# Patient Record
Sex: Male | Born: 1960 | Race: Black or African American | Hispanic: No | Marital: Married | Smoking: Former smoker
Health system: Southern US, Community
[De-identification: ages and names within clinical notes are randomized; demographics above are authoritative.]

## PROBLEM LIST (undated history)

## (undated) DIAGNOSIS — Z860100 Personal history of colon polyps, unspecified: Secondary | ICD-10-CM

## (undated) DIAGNOSIS — I10 Essential (primary) hypertension: Secondary | ICD-10-CM

## (undated) DIAGNOSIS — R0609 Other forms of dyspnea: Secondary | ICD-10-CM

## (undated) DIAGNOSIS — R972 Elevated prostate specific antigen [PSA]: Secondary | ICD-10-CM

## (undated) DIAGNOSIS — J45909 Unspecified asthma, uncomplicated: Secondary | ICD-10-CM

## (undated) DIAGNOSIS — C61 Malignant neoplasm of prostate: Secondary | ICD-10-CM

## (undated) DIAGNOSIS — R42 Dizziness and giddiness: Secondary | ICD-10-CM

## (undated) DIAGNOSIS — N200 Calculus of kidney: Secondary | ICD-10-CM

## (undated) DIAGNOSIS — G43909 Migraine, unspecified, not intractable, without status migrainosus: Secondary | ICD-10-CM

## (undated) DIAGNOSIS — R0600 Dyspnea, unspecified: Secondary | ICD-10-CM

## (undated) DIAGNOSIS — K219 Gastro-esophageal reflux disease without esophagitis: Secondary | ICD-10-CM

## (undated) HISTORY — DX: Dyspnea, unspecified: R06.00

## (undated) HISTORY — DX: Gastro-esophageal reflux disease without esophagitis: K21.9

## (undated) HISTORY — DX: Personal history of colonic polyps: Z86.010

## (undated) HISTORY — DX: Other forms of dyspnea: R06.09

## (undated) HISTORY — DX: Personal history of colon polyps, unspecified: Z86.0100

## (undated) HISTORY — DX: Elevated prostate specific antigen (PSA): R97.20

## (undated) HISTORY — PX: OTHER SURGICAL HISTORY: SHX169

## (undated) HISTORY — DX: Unspecified asthma, uncomplicated: J45.909

## (undated) HISTORY — DX: Dizziness and giddiness: R42

## (undated) HISTORY — DX: Migraine, unspecified, not intractable, without status migrainosus: G43.909

---

## 1998-12-26 ENCOUNTER — Emergency Department (HOSPITAL_COMMUNITY): Admission: EM | Admit: 1998-12-26 | Discharge: 1998-12-26 | Payer: Self-pay | Admitting: Emergency Medicine

## 1998-12-26 ENCOUNTER — Encounter: Payer: Self-pay | Admitting: Emergency Medicine

## 1999-01-03 ENCOUNTER — Encounter: Admission: RE | Admit: 1999-01-03 | Discharge: 1999-01-03 | Payer: Self-pay | Admitting: Internal Medicine

## 1999-02-02 ENCOUNTER — Encounter: Admission: RE | Admit: 1999-02-02 | Discharge: 1999-02-02 | Payer: Self-pay | Admitting: Internal Medicine

## 2001-03-29 ENCOUNTER — Emergency Department (HOSPITAL_COMMUNITY): Admission: EM | Admit: 2001-03-29 | Discharge: 2001-03-29 | Payer: Self-pay | Admitting: Emergency Medicine

## 2002-04-08 ENCOUNTER — Encounter (INDEPENDENT_AMBULATORY_CARE_PROVIDER_SITE_OTHER): Payer: Self-pay | Admitting: *Deleted

## 2002-04-08 ENCOUNTER — Ambulatory Visit (HOSPITAL_BASED_OUTPATIENT_CLINIC_OR_DEPARTMENT_OTHER): Admission: RE | Admit: 2002-04-08 | Discharge: 2002-04-08 | Payer: Self-pay | Admitting: General Surgery

## 2005-03-21 ENCOUNTER — Emergency Department (HOSPITAL_COMMUNITY): Admission: EM | Admit: 2005-03-21 | Discharge: 2005-03-21 | Payer: Self-pay | Admitting: Emergency Medicine

## 2007-04-01 ENCOUNTER — Emergency Department (HOSPITAL_COMMUNITY): Admission: EM | Admit: 2007-04-01 | Discharge: 2007-04-01 | Payer: Self-pay | Admitting: Emergency Medicine

## 2007-04-03 ENCOUNTER — Emergency Department (HOSPITAL_COMMUNITY): Admission: EM | Admit: 2007-04-03 | Discharge: 2007-04-03 | Payer: Self-pay | Admitting: Emergency Medicine

## 2010-08-17 NOTE — Op Note (Signed)
Rodney Graves, Rodney Graves                           ACCOUNT NO.:  [id number]   MEDICAL RECORD NO.:  [id number]                   PATIENT TYPE:  AMB   LOCATION:  DSC                                  FACILITY:  [facility]   PHYSICIAN:  Ollen Gross. Vernell Morgans, M.D.              DATE OF BIRTH:  May 02, 1960   DATE OF PROCEDURE:  04/08/2002  DATE OF DISCHARGE:                                 OPERATIVE REPORT   PREOPERATIVE DIAGNOSES:  Lipoma of right forearm, left shoulder and small  mole on left forearm.   POSTOPERATIVE DIAGNOSES:  Lipoma of right forearm, left shoulder and small  mole on left forearm.   OPERATION PERFORMED:  Excision of lipoma from right forearm, left shoulder  and mole from left forearm.   SURGEON:  Ollen Gross. Carolynne Edouard, M.D.   ANESTHESIA:  Local with sedation.   DESCRIPTION OF PROCEDURE:  After informed consent was obtained, the patient  was brought to the operating room and placed in supine position on the  operating table.  After some IV sedation had been given, the patient's  initially right forearm was prepped with Betadine and draped in the usual  sterile manner.  The area in question was infiltrated with 1% lidocaine with  epinephrine and a small longitudinal incision was made overlying the mass in  question.  The mass was then able to be excised sharply with a scissors from  the rest of the subcutaneous tissue.  Hemostasis was achieved using the  Bovie electrocautery.  Once this was accomplished, the incision was closed  with a running 4-0 Monocryl subcuticular stitch.  Benzoin, Steri-Strips and  sterile dressings were applied.  Next, attention was then turned to the left  shoulder and left forearm.  These areas were prepped with Betadine and  draped in the usual sterile manner as was the initial lesion on the right  arm.  Each of the areas in question was infiltrated with 1% lidocaine with  epinephrine.  A small elliptical incision was made around the mole on the  left forearm  sharply with a 15 blade knife.  This incision was carried down  to the subcutaneous fat and the area was excised sharply with a 15 blade  knife.  The incision was then closed with interrupted 4-0 Monocryl  subcuticular stitches.  Benzoin, Steri-Strips and sterile dressings were  applied.  Next, attention was then turned to the right shoulder.  Again the  area had already been infiltrated with 1% lidocaine with epinephrine.  A  longitudinal incision was made overlying the mass in question with a 15  blade knife and the fatty tissue was separated from the rest of the  subcutaneous fat using sharp dissection with a pair of Metzenbaum scissors.  This was done circumferentially until the mass was freed and removed from  the patient.  The wound was examined and found to be hemostatic.  The skin  incision was then closed with running 4-0 Monocryl subcuticular stitch.  Each of the lipomas was approximately 3 cm in diameter and the lesion on the  left forearm was approximately  1 cm in diameter.  Benzoin, Steri-Strips and sterile dressings were applied  to all incisions.  The patient was taken to the recovery room in stable  condition.                                                   Ollen Gross. Vernell Morgans, M.D.   PST/MEDQ  D:  04/08/2002  T:  04/08/2002  Job:  [id number]

## 2011-01-04 LAB — CULTURE, ROUTINE-ABSCESS

## 2011-04-11 ENCOUNTER — Ambulatory Visit (INDEPENDENT_AMBULATORY_CARE_PROVIDER_SITE_OTHER): Payer: Self-pay | Admitting: Surgery

## 2013-08-30 ENCOUNTER — Encounter (HOSPITAL_COMMUNITY): Payer: Self-pay | Admitting: Emergency Medicine

## 2013-08-30 ENCOUNTER — Emergency Department (HOSPITAL_COMMUNITY)
Admission: EM | Admit: 2013-08-30 | Discharge: 2013-08-30 | Disposition: A | Discharge status: Home / Self Care | Payer: BC Managed Care – PPO | Attending: Emergency Medicine | Admitting: Emergency Medicine

## 2013-08-30 ENCOUNTER — Emergency Department (HOSPITAL_COMMUNITY): Payer: BC Managed Care – PPO

## 2013-08-30 DIAGNOSIS — I10 Essential (primary) hypertension: Secondary | ICD-10-CM | POA: Insufficient documentation

## 2013-08-30 DIAGNOSIS — R112 Nausea with vomiting, unspecified: Secondary | ICD-10-CM | POA: Insufficient documentation

## 2013-08-30 DIAGNOSIS — N200 Calculus of kidney: Secondary | ICD-10-CM

## 2013-08-30 HISTORY — DX: Essential (primary) hypertension: I10

## 2013-08-30 HISTORY — DX: Calculus of kidney: N20.0

## 2013-08-30 LAB — URINE MICROSCOPIC-ADD ON

## 2013-08-30 LAB — COMPREHENSIVE METABOLIC PANEL
ALK PHOS: 86 U/L (ref 39–117)
ALT: 24 U/L (ref 0–53)
AST: 25 U/L (ref 0–37)
Albumin: 4.2 g/dL (ref 3.5–5.2)
BILIRUBIN TOTAL: 1 mg/dL (ref 0.3–1.2)
BUN: 12 mg/dL (ref 6–23)
CHLORIDE: 106 meq/L (ref 96–112)
CO2: 20 mEq/L (ref 19–32)
CREATININE: 1.16 mg/dL (ref 0.50–1.35)
Calcium: 9.2 mg/dL (ref 8.4–10.5)
GFR calc non Af Amer: 69 mL/min — ABNORMAL LOW (ref >90)
GFR, EST AFRICAN AMERICAN: 80 mL/min — AB (ref >90)
GLUCOSE: 132 mg/dL — AB (ref 70–99)
Potassium: 3.8 mEq/L (ref 3.7–5.3)
Sodium: 142 mEq/L (ref 137–147)
Total Protein: 7.7 g/dL (ref 6.0–8.3)

## 2013-08-30 LAB — CBC WITH DIFFERENTIAL/PLATELET
BASOS PCT: 0 % (ref 0–1)
Basophils Absolute: 0 10*3/uL (ref 0.0–0.1)
EOS ABS: 0 10*3/uL (ref 0.0–0.7)
Eosinophils Relative: 0 % (ref 0–5)
HEMATOCRIT: 35.7 % — AB (ref 39.0–52.0)
Hemoglobin: 12.1 g/dL — ABNORMAL LOW (ref 13.0–17.0)
LYMPHS ABS: 1.7 10*3/uL (ref 0.7–4.0)
Lymphocytes Relative: 15 % (ref 12–46)
MCH: 29.8 pg (ref 26.0–34.0)
MCHC: 33.9 g/dL (ref 30.0–36.0)
MCV: 87.9 fL (ref 78.0–100.0)
MONOS PCT: 4 % (ref 3–12)
Monocytes Absolute: 0.5 10*3/uL (ref 0.1–1.0)
NEUTROS ABS: 9.4 10*3/uL — AB (ref 1.7–7.7)
Neutrophils Relative %: 81 % — ABNORMAL HIGH (ref 43–77)
Platelets: 251 10*3/uL (ref 150–400)
RBC: 4.06 MIL/uL — ABNORMAL LOW (ref 4.22–5.81)
RDW: 12.6 % (ref 11.5–15.5)
WBC: 11.7 10*3/uL — ABNORMAL HIGH (ref 4.0–10.5)

## 2013-08-30 LAB — URINALYSIS, ROUTINE W REFLEX MICROSCOPIC
BILIRUBIN URINE: NEGATIVE
Glucose, UA: NEGATIVE mg/dL
KETONES UR: NEGATIVE mg/dL
LEUKOCYTES UA: NEGATIVE
NITRITE: NEGATIVE
PH: 7 (ref 5.0–8.0)
PROTEIN: 30 mg/dL — AB
Specific Gravity, Urine: 1.019 (ref 1.005–1.030)
UROBILINOGEN UA: 1 mg/dL (ref 0.0–1.0)

## 2013-08-30 LAB — LIPASE, BLOOD: LIPASE: 32 U/L (ref 11–59)

## 2013-08-30 MED ORDER — OXYCODONE-ACETAMINOPHEN 5-325 MG PO TABS: 1.0000 | ORAL_TABLET | Freq: Four times a day (QID) | ORAL | Status: DC | PRN

## 2013-08-30 MED ORDER — ONDANSETRON HCL 4 MG/2ML IJ SOLN
4.0000 mg | Freq: Once | INTRAMUSCULAR | Status: AC
  Administered 2013-08-30: 4 mg via INTRAVENOUS
  Filled 2013-08-30: qty 2

## 2013-08-30 MED ORDER — TAMSULOSIN HCL 0.4 MG PO CAPS: 0.4000 mg | ORAL_CAPSULE | Freq: Every day | ORAL | Status: DC

## 2013-08-30 MED ORDER — ONDANSETRON HCL 4 MG PO TABS: 4.0000 mg | ORAL_TABLET | Freq: Four times a day (QID) | ORAL | Status: DC

## 2013-08-30 MED ORDER — HYDROMORPHONE HCL PF 1 MG/ML IJ SOLN
1.0000 mg | Freq: Once | INTRAMUSCULAR | Status: AC
  Administered 2013-08-30: 1 mg via INTRAVENOUS
  Filled 2013-08-30: qty 1

## 2013-08-30 NOTE — ED Notes (Signed)
Pt oxygen saturation 88%-90% after medication given, pt placed on nasal cannula 2L, oxygen saturation 98%.

## 2013-08-30 NOTE — ED Notes (Signed)
Pt from home with c/o right flank pain with radiation of pain to right side of lower back. Pt states hx of kidney stones x4, states he started having increase in pain and abdominal pain at 0900 this morning. Pt also reports n/v but denies any diarrhea. States decrease in urine output and states urine has been darker in color. Pt in pain upon arrival to room. Axo x4. Skin clammy and diaphoretic.

## 2013-08-30 NOTE — Discharge Instructions (Signed)
Kidney Stones Kidney stones (urolithiasis) are deposits that form inside your kidneys. The intense pain is caused by the stone moving through the urinary tract. When the stone moves, the ureter goes into spasm around the stone. The stone is usually passed in the urine.  CAUSES   A disorder that makes certain neck glands produce too much parathyroid hormone (primary hyperparathyroidism).  A buildup of uric acid crystals, similar to gout in your joints.  Narrowing (stricture) of the ureter.  A kidney obstruction present at birth (congenital obstruction).  Previous surgery on the kidney or ureters.  Numerous kidney infections. SYMPTOMS   Feeling sick to your stomach (nauseous).  Throwing up (vomiting).  Blood in the urine (hematuria).  Pain that usually spreads (radiates) to the groin.  Frequency or urgency of urination. DIAGNOSIS   Taking a history and physical exam.  Blood or urine tests.  CT scan.  Occasionally, an examination of the inside of the urinary bladder (cystoscopy) is performed. TREATMENT   Observation.  Increasing your fluid intake.  Extracorporeal shock wave lithotripsy This is a noninvasive procedure that uses shock waves to break up kidney stones.  Surgery may be needed if you have severe pain or persistent obstruction. There are various surgical procedures. Most of the procedures are performed with the use of small instruments. Only small incisions are needed to accommodate these instruments, so recovery time is minimized. The size, location, and chemical composition are all important variables that will determine the proper choice of action for you. Talk to your health care provider to better understand your situation so that you will minimize the risk of injury to yourself and your kidney.  HOME CARE INSTRUCTIONS   Drink enough water and fluids to keep your urine clear or pale yellow. This will help you to pass the stone or stone fragments.  Strain  all urine through the provided strainer. Keep all particulate matter and stones for your health care provider to see. The stone causing the pain may be as small as a grain of salt. It is very important to use the strainer each and every time you pass your urine. The collection of your stone will allow your health care provider to analyze it and verify that a stone has actually passed. The stone analysis will often identify what you can do to reduce the incidence of recurrences.  Only take over-the-counter or prescription medicines for pain, discomfort, or fever as directed by your health care provider.  Make a follow-up appointment with your health care provider as directed.  Get follow-up X-rays if required. The absence of pain does not always mean that the stone has passed. It may have only stopped moving. If the urine remains completely obstructed, it can cause loss of kidney function or even complete destruction of the kidney. It is your responsibility to make sure X-rays and follow-ups are completed. Ultrasounds of the kidney can show blockages and the status of the kidney. Ultrasounds are not associated with any radiation and can be performed easily in a matter of minutes. SEEK MEDICAL CARE IF:  You experience pain that is progressive and unresponsive to any pain medicine you have been prescribed. SEEK IMMEDIATE MEDICAL CARE IF:   Pain cannot be controlled with the prescribed medicine.  You have a fever or shaking chills.  The severity or intensity of pain increases over 18 hours and is not relieved by pain medicine.  You develop a new onset of abdominal pain.  You feel faint or pass  out.  You are unable to urinate. MAKE SURE YOU:   Understand these instructions.  Will watch your condition.  Will get help right away if you are not doing well or get worse. Document Released: 03/18/2005 Document Revised: 11/18/2012 Document Reviewed: 08/19/2012 [facility] Patient Information 2014  [place].  Diet for Kidney Stones Kidney stones are small, hard masses that form inside your kidneys. They are made up of salts and minerals and often form when high levels build up in the urine. The minerals can then start to build up, crystalize, and stick together to form stones. There are several different types of kidney stones. The following types of stones may be influenced by dietary factors:   Calcium Oxalate Stones. An oxalate is a salt found in certain foods. Within the body, calcium can combine with oxalates to form calcium oxalate stones, which can be excreted in the urine in high amounts. This is the most common type of kidney stone.  Calcium Phosphate Stones. These stones may occur when the pH of the urine becomes too high, or less acidic, from too much calcium being excreted in the urine. The pH is a measure of how acidic or basic a substance is.  Uric Acid Stones. This type of stone occurs when the pH of the urine becomes too low, or very acidic, because substances called purines build up in the urine. Purines are found in animal proteins. When the urine is highly concentrated with acid, uric acid kidney stones can form.  Other risk factors for kidney stones include genetics, environment, and being overweight. Your caregiver may ask you to follow specific diet guidelines based on the type of stone you have to lessen the chances of your body making more kidney stones.  GENERAL GUIDELINES FOR ALL TYPES OF STONES  Drink plenty of fluid. Drink 12 16 cups of fluid a day, drinking mainly water.This is the most important thing you can do to prevent the formation of future kidney stones.  Maintain a healthy weight. Your caregiver or dietitian can help you determine what a healthy weight is for you. If you are overweight, weight loss may help prevent the formation of future kidney stones.  Eat a diet adequate in animal protein. Too much animal protein can contribute to the formation  of stones. Your dietitian can help you determine how much protein you should be eating. Avoid low carbohydrate, high protein diets.  Follow a balanced eating approach. The DASH diet, which stands for "Dietary Approaches to Stop Hypertension," is an effective meal plan for reducing stone formation. This diet is high in fruits, vegetables, dairy, and whole grains and low in animal protein. Ask your caregiver or dietitian for information about the DASH diet. ADDITIONAL DIET GUIDELINES FOR CALCIUM STONES Avoid foods high in salt. This includes table salt, salt seasonings, MSG, soy sauce, cured and processed meats, salted crackers and snack foods, fast food, and canned soups and foods. Ask your caregiver or dietitian for information about reducing sodium in your diet or following the low sodium diet.  Ensure adequate calcium intake. Use the following table for calcium guidelines:  Men 81 years old and younger  1000 mg/day.  Men 46 years old and older  1500 mg/day.  Women 51 55 years old  1000 mg/day.  Women 50 years and older  1500 mg/day. Your dietitian can help you determine if you are getting enough calcium in your diet. Foods that are high in calcium include dairy products, broccoli, cheese, yogurt, and  pudding. If you need to take a calcium supplement, take it only in the form of calcium citrate.  °Avoid foods high in oxalate. Be sure that any supplements you take do not contain more than 500 mg of vitamin C. Vitamin C is converted into oxalate in the body. You do not need to avoid fruits and vegetables high in vitamin C.  °· Grains: High-fiber or bran cereal, whole-wheat bread, grits, barley, buckwheat, amaranth, pretzels, and fruitcake. °· Vegetables: Dried beans, wax beans, dark leafy greens, eggplant, leeks, okra, parsley, rutabaga, tomato paste, watercress, zucchini, and escarole. °· Fruit: Dried apricots, red currants, figs, kiwi, and rhubarb. °· Meat and Meat Substitutes: Soybeans and foods made  from soy (soyburger, miso), dried beans, peanut butter. °· Milk: Chocolate milk mixes and soymilk. °· Fats and Oils: Nuts (peanuts, almonds, pecans, cashews, hazelnuts) and nut butters, sesame seeds, and tDahini paste. °· Condiments/Miscellaneous: Chocolate, carob, marmalade, poppy seeds, instant iced tea, and juice from high-oxalate fruits.    °Document Released: 07/13/2010 Document Revised: 09/17/2011 Document Reviewed: 09/02/2011 °[employer]® Patient Information ©2014 [employer]. ° °

## 2013-08-30 NOTE — ED Provider Notes (Signed)
CSN: [id number]     Arrival date & time 08/30/13  1253 History   First MD Initiated Contact with Patient 08/30/13 1317     Chief Complaint  Patient presents with  . Flank Pain  . Nausea     (Consider location/radiation/quality/duration/timing/severity/associated sxs/prior Treatment) HPI Comments: Patient presents with a chief complaint of right flank pain.  He reports that the pain radiates to his right groin.  He reports that the pain has been intermittent since 9 AM this morning.  He reports that at time the pain becomes very sharp and then the pain will ease up.  He has not taken anything for pain prior to arrival.  He reports four episodes of associated vomiting.  He denies diarrhea.  Denies abdominal pain.  Denies fever or chills.  Denies dysuria, hematuria, or increased urinary frequency.  He reports that his urine has been darker in color.  He reports a history of Kidney Stones.  Last stone was 19 years ago.  Patient is a 53 y.o. male presenting with flank pain. The history is provided by the patient.  Flank Pain    Past Medical History  Diagnosis Date  . Hypertension   . Kidney stone    History reviewed. No pertinent past surgical history. History reviewed. No pertinent family history. History  Substance Use Topics  . Smoking status: Never Smoker   . Smokeless tobacco: Not on file  . Alcohol Use: No    Review of Systems  Genitourinary: Positive for flank pain.  All other systems reviewed and are negative.     Allergies  Codeine  Home Medications   Prior to Admission medications   Medication Sig Start Date End Date Taking? Authorizing Provider  amLODipine (NORVASC) 5 MG tablet Take 5 mg by mouth daily. 08/09/13  Yes Historical Provider, MD   BP 128/81  Pulse 66  Temp(Src) 98.3 F (36.8 C) (Oral)  Resp 20  Ht 6\' 1"  (1.854 m)  Wt 195 lb (88.451 kg)  BMI 25.73 kg/m2  SpO2 98% Physical Exam  Nursing note and vitals reviewed. Constitutional: He appears  well-developed and well-nourished.  HENT:  Head: Normocephalic and atraumatic.  Mouth/Throat: Oropharynx is clear and moist.  Neck: Normal range of motion. Neck supple.  Cardiovascular: Normal rate, regular rhythm and normal heart sounds.   Pulmonary/Chest: Effort normal and breath sounds normal.  Abdominal: Soft. Bowel sounds are normal. He exhibits no distension and no mass. There is no tenderness. There is no rebound and no guarding.  Right CVA tenderness  Neurological: He is alert.  Skin: Skin is warm and dry.  Psychiatric: He has a normal mood and affect.    ED Course  Procedures (including critical care time) Labs Review Labs Reviewed  CBC WITH DIFFERENTIAL - Abnormal; Notable for the following:    WBC 11.7 (*)    RBC 4.06 (*)    Hemoglobin 12.1 (*)    HCT 35.7 (*)    Neutrophils Relative % 81 (*)    Neutro Abs 9.4 (*)    All other components within normal limits  COMPREHENSIVE METABOLIC PANEL - Abnormal; Notable for the following:    Glucose, Bld 132 (*)    GFR calc non Af Amer 69 (*)    GFR calc Af Amer 80 (*)    All other components within normal limits  LIPASE, BLOOD  URINALYSIS, ROUTINE W REFLEX MICROSCOPIC    Imaging Review Ct Abdomen Pelvis Wo Contrast  08/30/2013   CLINICAL DATA:  Right  flank pain  EXAM: CT ABDOMEN AND PELVIS WITHOUT CONTRAST  TECHNIQUE: Multidetector CT imaging of the abdomen and pelvis was performed following the standard protocol without IV contrast.  COMPARISON:  None.  FINDINGS: Lower Chest: Dependent atelectasis in the lower lobes. No suspicious pulmonary nodule or mass. Borderline cardiomegaly. The intracardiac blood pool is hypodense relative to the adjacent myocardium consistent with anemia. Trace calcification of the papillary muscles on the left ventricle suggest sequelae of remote myocardial infarction. Unremarkable visualized thoracic esophagus.  Abdomen: Unenhanced CT was performed per clinician order. Lack of IV contrast limits  sensitivity and specificity, especially for evaluation of abdominal/pelvic solid viscera. Within these limitations, unremarkable CT appearance of the stomach, duodenum, spleen, adrenal glands and pancreas. Normal hepatic contour and morphology. No discrete hepatic lesion. Gallbladder is unremarkable. No intra or extrahepatic biliary ductal dilatation.  Moderate right hydronephrosis secondary to an obstructing 4 mm stone in the mid ureter. There is mild right renal edema and perinephric stranding. No additional nephrolithiasis is identified involving either kidney.  No evidence of obstruction or focal bowel wall thickening. Normal appendix in the right lower quadrant. The terminal ileum is unremarkable. No free fluid or suspicious adenopathy.  Pelvis: Unremarkable bladder, prostate gland and seminal vesicles. No free fluid or suspicious adenopathy.  Bones/Soft Tissues: No acute fracture. Mildly abnormal appearance of the marrow space of the left iliac bone with regions of circumscribes the lucency with narrow zones of transition and patchy areas of increased sclerosis with a somewhat ground-glass attenuation adjacent to the sacroiliac joint. In the superior aspect of the left iliac crest there is a 2.5 x 0.7 cm circumscribed lucency which can serve as an index lesion. Dystrophic calcification in the origin of the left rectus femoris consistent with remote strain/tear.  Vascular: Atherosclerotic vascular disease without significant stenosis or aneurysmal dilatation.  IMPRESSION: 1. Obstructing 4 mm right mid ureteral stone with associated moderate right hydronephrosis, mild renal edema and perinephric stranding. 2. No additional nephrolithiasis identified. 3. Nonspecific mixed circumscribed lucencies and patchy ground-glass attenuation sclerosis in the left iliac bone as described above. No overtly aggressive or suspicious features. This is favored to reflect a benign process with differential considerations including  fibrous dysplasia, hemangioma and other benign fibro-osseous lesions. Neoplastic process including metastatic disease is considered significantly less likely. Recommend followup CT scan of the pelvis in 4-6 months to confirm stability. 4. Trace calcification of the left ventricular papillary muscles suggests sequelae of remote myocardial infarction. 5. Borderline cardiomegaly. 6. The intracardiac blood pool is hypodense relative to the adjacent myocardium consistent with anemia.   Electronically Signed   By: Jacqulynn Cadet M.D.   On: 08/30/2013 15:17     EKG Interpretation None     2:45 PM Reassessed patient.  He reports significant improvement in pain.   MDM   Final diagnoses:  None   Pt has been diagnosed with a Kidney Stone via CT.  Creatine WNL.  UA not showing signs of infection.  Pain and nausea tolerable at time of discharge.  Patient tolerating PO liquids.  Feel that the patient is stable for discharge.  Patient discharged home with Rx for Flomax, Percocet, and Zofran.  Patient given follow up with Urology.  Return precautions given.     Hyman Bible, PA-C 08/30/13 1936

## 2013-09-01 NOTE — ED Provider Notes (Signed)
Medical screening examination/treatment/procedure(s) were performed by non-physician practitioner and as supervising physician I was immediately available for consultation/collaboration.   Dot Lanes, MD 09/01/13 2039

## 2014-10-28 IMAGING — CT CT ABD-PELV W/O CM
1 of 9 series · 3 of 46 positions shown, 4 images · non-contrast
Comparison: None.

CLINICAL DATA: Right flank pain

EXAM:
CT ABDOMEN AND PELVIS WITHOUT CONTRAST
TECHNIQUE: Multidetector CT imaging of the abdomen and pelvis was performed
following the standard protocol without IV contrast.

[Series 208: sagittal · coronal · 0.50mm/px · 3 of 124 slices shown, 4 images]
[im 31/124  soft-tissue]
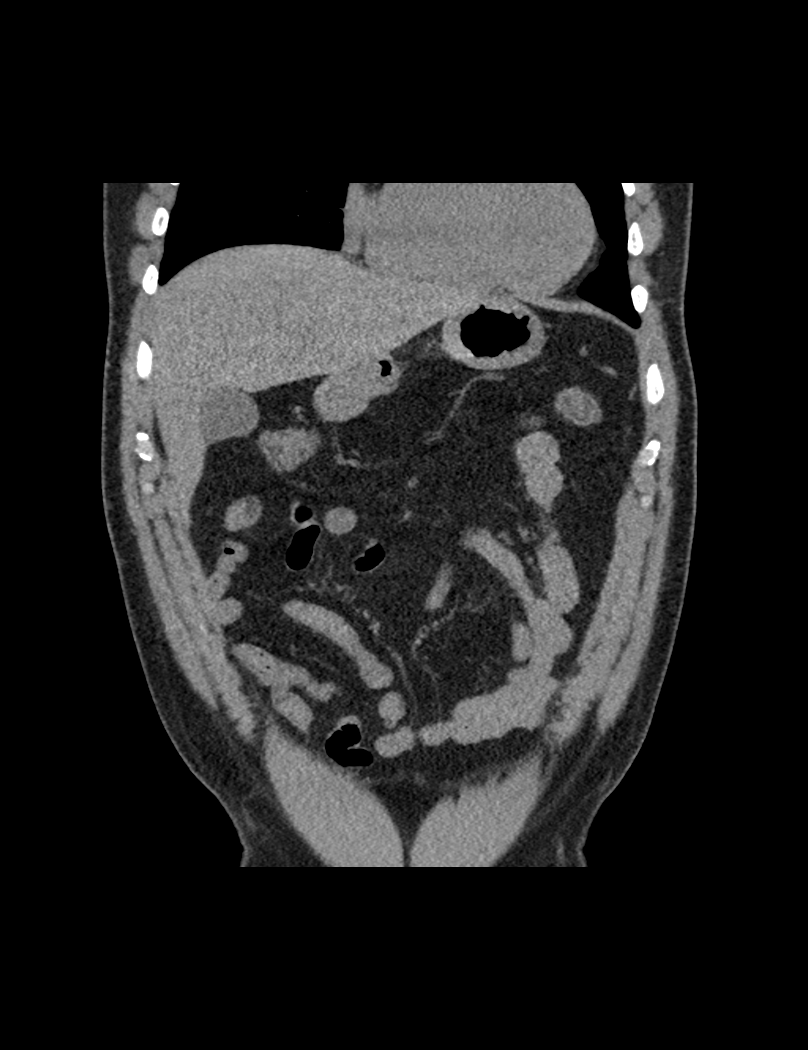
[im 31/124  bone]
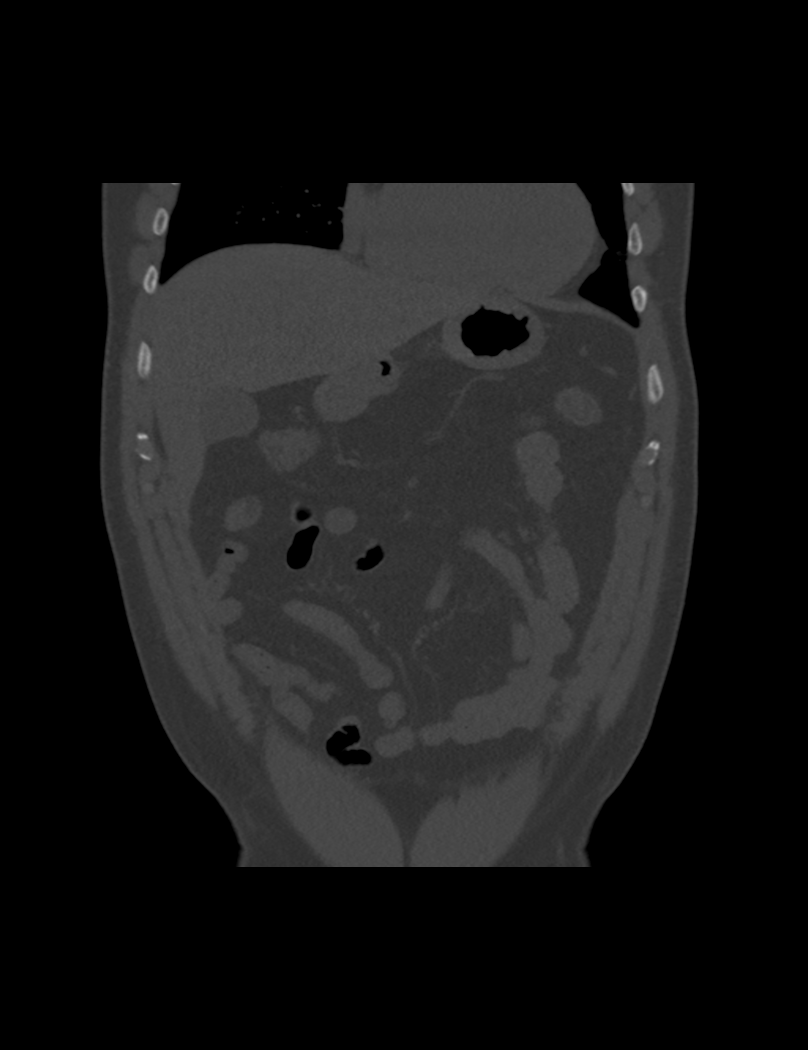
[im 62/124  soft-tissue]
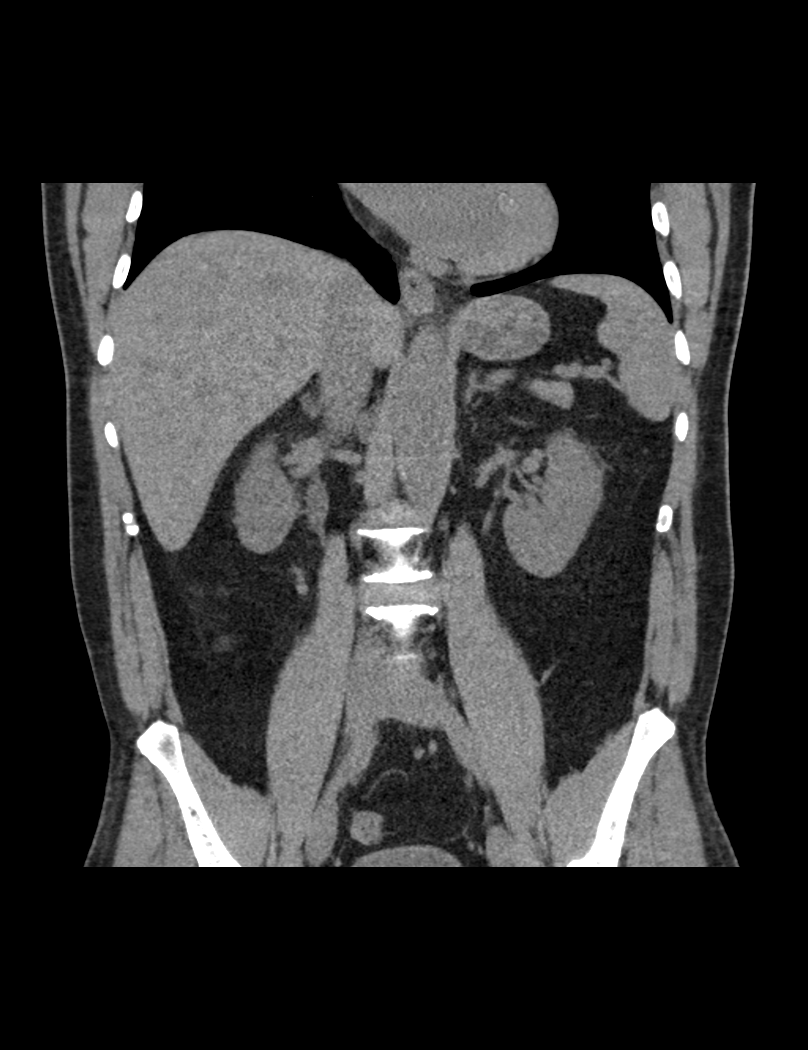
[im 93/124  soft-tissue]
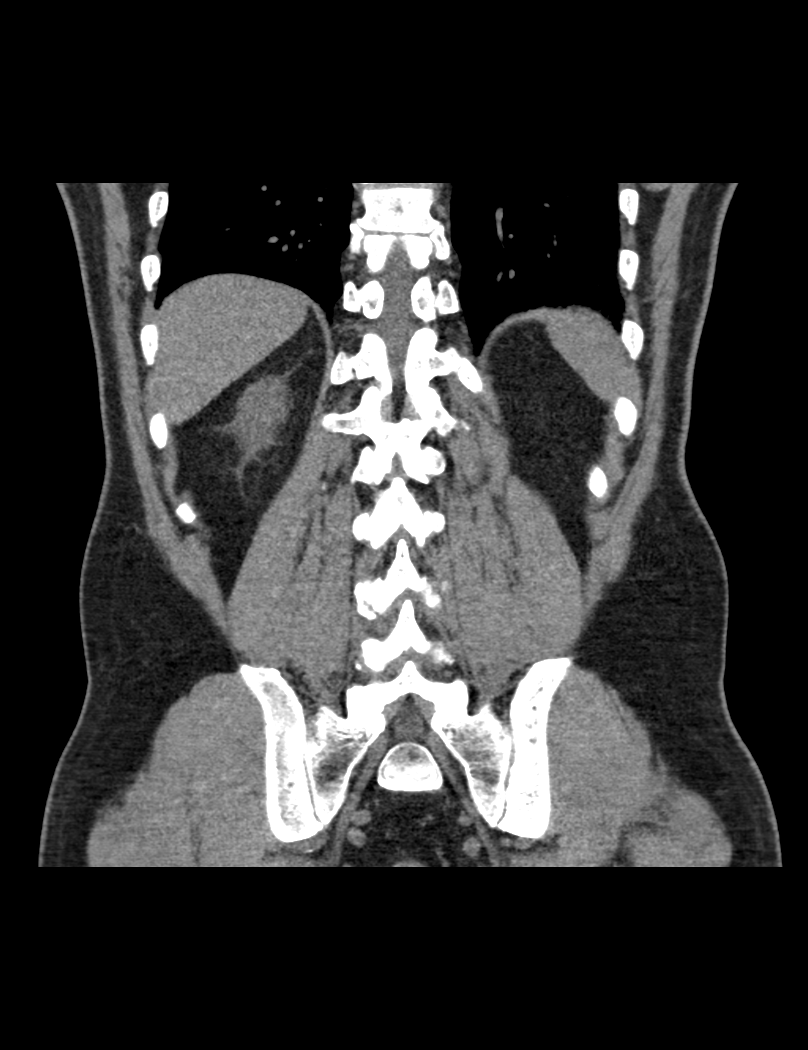

[3 of 46 positions shown; findings below may reference images not displayed]

FINDINGS: Lower Chest: Dependent atelectasis in the lower lobes. No suspicious
pulmonary nodule or mass. Borderline cardiomegaly. The intracardiac
blood pool is hypodense relative to the adjacent myocardium
consistent with anemia. Trace calcification of the papillary muscles
on the left ventricle suggest sequelae of remote myocardial
infarction. Unremarkable visualized thoracic esophagus.

Abdomen: Unenhanced CT was performed per clinician order. Lack of IV
contrast limits sensitivity and specificity, especially for
evaluation of abdominal/pelvic solid viscera. Within these
limitations, unremarkable CT appearance of the stomach, duodenum,
spleen, adrenal glands and pancreas. Normal hepatic contour and
morphology. No discrete hepatic lesion. Gallbladder is unremarkable.
No intra or extrahepatic biliary ductal dilatation.

Moderate right hydronephrosis secondary to an obstructing 4 mm stone
in the mid ureter. There is mild right renal edema and perinephric
stranding. No additional nephrolithiasis is identified involving
either kidney.

No evidence of obstruction or focal bowel wall thickening. Normal
appendix in the right lower quadrant. The terminal ileum is
unremarkable. No free fluid or suspicious adenopathy.

Pelvis: Unremarkable bladder, prostate gland and seminal vesicles.
No free fluid or suspicious adenopathy.

Bones/Soft Tissues: No acute fracture. Mildly abnormal appearance of
the marrow space of the left iliac bone with regions of
circumscribes the lucency with narrow zones of transition and patchy
areas of increased sclerosis with a somewhat ground-glass
attenuation adjacent to the sacroiliac joint. In the superior aspect
of the left iliac crest there is a 2.5 x 0.7 cm circumscribed
lucency which can serve as an index lesion. Dystrophic calcification
in the origin of the left rectus femoris consistent with remote
strain/tear.

Vascular: Atherosclerotic vascular disease without significant
stenosis or aneurysmal dilatation.
IMPRESSION: 1. Obstructing 4 mm right mid ureteral stone with associated
moderate right hydronephrosis, mild renal edema and perinephric
stranding.
2. No additional nephrolithiasis identified.
3. Nonspecific mixed circumscribed lucencies and patchy ground-glass
attenuation sclerosis in the left iliac bone as described above. No
overtly aggressive or suspicious features. This is favored to
reflect a benign process with differential considerations including
fibrous dysplasia, hemangioma and other benign fibro-osseous
lesions. Neoplastic process including metastatic disease is
considered significantly less likely. Recommend followup CT scan of
the pelvis in 4-6 months to confirm stability.
4. Trace calcification of the left ventricular papillary muscles
suggests sequelae of remote myocardial infarction.
5. Borderline cardiomegaly.
6. The intracardiac blood pool is hypodense relative to the adjacent
myocardium consistent with anemia.

## 2016-04-03 ENCOUNTER — Other Ambulatory Visit: Payer: Self-pay | Admitting: Urology

## 2016-04-03 DIAGNOSIS — R972 Elevated prostate specific antigen [PSA]: Secondary | ICD-10-CM

## 2016-04-11 ENCOUNTER — Ambulatory Visit (HOSPITAL_COMMUNITY): Admission: RE | Admit: 2016-04-11 | Payer: BLUE CROSS/BLUE SHIELD | Source: Ambulatory Visit

## 2016-04-26 ENCOUNTER — Ambulatory Visit (HOSPITAL_COMMUNITY)
Admission: RE | Admit: 2016-04-26 | Discharge: 2016-04-26 | Disposition: A | Discharge status: Home / Self Care | Payer: BLUE CROSS/BLUE SHIELD | Source: Ambulatory Visit | Attending: Urology | Admitting: Urology

## 2016-04-26 DIAGNOSIS — N429 Disorder of prostate, unspecified: Secondary | ICD-10-CM | POA: Insufficient documentation

## 2016-04-26 DIAGNOSIS — R972 Elevated prostate specific antigen [PSA]: Secondary | ICD-10-CM | POA: Diagnosis not present

## 2016-04-26 LAB — POCT I-STAT CREATININE: Creatinine, Ser: 1.1 mg/dL (ref 0.61–1.24)

## 2016-04-26 MED ORDER — GADOBENATE DIMEGLUMINE 529 MG/ML IV SOLN
20.0000 mL | Freq: Once | INTRAVENOUS | Status: AC | PRN
  Administered 2016-04-26: 19 mL via INTRAVENOUS

## 2016-04-26 MED ORDER — LIDOCAINE HCL 2 % EX GEL: 1.0000 "application " | Freq: Once | CUTANEOUS | Status: DC

## 2016-04-26 MED ORDER — LIDOCAINE HCL 2 % EX GEL
CUTANEOUS | Status: AC
  Filled 2016-04-26: qty 30

## 2016-05-01 ENCOUNTER — Ambulatory Visit (HOSPITAL_COMMUNITY): Payer: BLUE CROSS/BLUE SHIELD

## 2016-06-06 DIAGNOSIS — R972 Elevated prostate specific antigen [PSA]: Secondary | ICD-10-CM | POA: Diagnosis not present

## 2016-06-27 DIAGNOSIS — C61 Malignant neoplasm of prostate: Secondary | ICD-10-CM | POA: Diagnosis not present

## 2016-07-15 ENCOUNTER — Telehealth: Payer: Self-pay | Admitting: Medical Oncology

## 2016-07-15 NOTE — Progress Notes (Signed)
I called pt to introduce myself as the Prostate Nurse Navigator and the Coordinator of the [facility] [place].  1. I confirmed with the patient he is aware of his referral to the clinic April 24,2018 arriving at 12:30 pm.  2. I discussed the format of the clinic and the physicians he will be seeing that day.  3. I discussed where the clinic is located and how to contact me. He is aware of valet parking and I asked him to have lunch before his arrival due to the length of the clinic.  4. I confirmed his address and informed him I would be mailing a packet of information and forms to be completed. I asked him to bring them with him the day of his appointment.   He voiced understanding of the above. I asked him to call me if he has any questions or concerns regarding his appointments or the forms he needs to complete.

## 2016-07-16 ENCOUNTER — Encounter: Payer: Self-pay | Admitting: Medical Oncology

## 2016-07-22 ENCOUNTER — Telehealth: Payer: Self-pay | Admitting: Medical Oncology

## 2016-07-22 NOTE — Progress Notes (Signed)
Requested pathology slides from [facility]

## 2016-07-22 NOTE — Progress Notes (Signed)
Left a message reminding patient of appointment for Prostate [facility] 07/23/16 arriving at 12:30 pm. I reminded him to bring his completed medical forms and to have lunch before his arrival.

## 2016-07-23 ENCOUNTER — Encounter: Payer: Self-pay | Admitting: Medical Oncology

## 2016-07-23 ENCOUNTER — Ambulatory Visit
Admission: RE | Admit: 2016-07-23 | Discharge: 2016-07-23 | Disposition: A | Discharge status: Home / Self Care | Payer: BLUE CROSS/BLUE SHIELD | Source: Ambulatory Visit | Attending: Radiation Oncology | Admitting: Radiation Oncology

## 2016-07-23 ENCOUNTER — Encounter: Payer: Self-pay | Admitting: Radiation Oncology

## 2016-07-23 ENCOUNTER — Encounter: Payer: Self-pay | Admitting: General Practice

## 2016-07-23 ENCOUNTER — Ambulatory Visit (HOSPITAL_BASED_OUTPATIENT_CLINIC_OR_DEPARTMENT_OTHER): Payer: BLUE CROSS/BLUE SHIELD | Admitting: Oncology

## 2016-07-23 DIAGNOSIS — I10 Essential (primary) hypertension: Secondary | ICD-10-CM | POA: Insufficient documentation

## 2016-07-23 DIAGNOSIS — Z8546 Personal history of malignant neoplasm of prostate: Secondary | ICD-10-CM | POA: Diagnosis not present

## 2016-07-23 DIAGNOSIS — Z87442 Personal history of urinary calculi: Secondary | ICD-10-CM | POA: Diagnosis not present

## 2016-07-23 DIAGNOSIS — C61 Malignant neoplasm of prostate: Secondary | ICD-10-CM | POA: Diagnosis not present

## 2016-07-23 DIAGNOSIS — R972 Elevated prostate specific antigen [PSA]: Secondary | ICD-10-CM | POA: Diagnosis not present

## 2016-07-23 HISTORY — DX: Malignant neoplasm of prostate: C61

## 2016-07-23 NOTE — Consult Note (Signed)
[facility]     07/23/2016   --------------------------------------------------------------------------------   Rodney Graves  MRN: [id number]  PRIMARY CARE:  Rodney Graves  DOB: [birth year], 56 year old Male  REFERRING:    SSN: [id number]**[id number]  PROVIDER:  Festus Graves, M.D.    TREATING:  Rodney Graves, M.D.    LOCATION:  [facility] [phone]   --------------------------------------------------------------------------------   CC/HPI: CC: Prostate Cancer   Physician requesting consult: Dr. Eda Graves  PCP: Dr. Seward Graves  Location of consult: [facility] - Prostate Cancer Multidisciplinary Clinic   [name] is a 56 year old gentleman with a history of an elevated PSA dating back to July 20, 2012. He underwent an initial TRUS biopsy of the prostate in January 2015 that was benign. Due to a continued rise in his PSA (11.4 in Nov 2016), he underwent another biopsy in December 2016 that demonstrated atypia at the left base in one core. His PSA continued to fluctuate and was again increased to 11.4 when he underwent an MRI of the prostate on 04/26/16. This demonstrated a 1.1 x 2.0 cm lesion in the right anterior prostate that was highly suspicious (PI-RADS 5). A repeat TRUS biopsy with MR/US fusion confirmed Gleason 3+4=7 adenocarcinoma. He had one systematic biopsy in the right mid gland positive for Gleason 3+3=6 adenocarcinoma but all 3 cores from the directed MR/US fusion biopsies were positive for Gleason 3+4=7 adenocarcinoma.   Family history: Positive. I have previously taken care of his brother who recently died of metastatic prostate cancer.   Imaging studies: MRI - No SVI, EPE, or LAD.   PMH: He has a history of hypertension.  PSH: No abdominal surgeries.   TNM stage: cT1c N0 Mx  PSA: 11.4  Gleason score: 3+4=7  Biopsy (06/06/16): 4/15 cores positive  Left: Benign  Right: R mid (10%, 3+3=6)  MR/US lesion in right anterior prostate:  3/3 cores positive (90%, 30%, 20%, 3+4=7)  Prostate volume: 46.2 cc   Nomogram  OC disease: 42%  EPE: 56%  SVI: 3%  LNI: 3%  PFS (5 year, 10 year): 83%,72%   Urinary function: IPSS is 6.  Erectile function: SHIM score is 20.     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 Ureter, Calculus of right ureter - 2013/07/20 Personal Hx urinary calculi, History of renal calculi - 20-Jul-2012    NON-GU PMH: Personal history of other diseases of the circulatory system, History of hypertension - Jul 20, 2012    FAMILY HISTORY: Death of family member - Runs In Family Prostate Cancer - Runs In Family   SOCIAL HISTORY: Marital Status: Married     Notes: Number of children, Former smoker, Occupation, Caffeine use, Alcohol use, Married   REVIEW OF SYSTEMS:    GU Review Male:   Patient denies frequent urination, hard to postpone urination, burning/ pain with urination, get up at night to urinate, leakage of urine, stream starts and stops, trouble starting your streams, and have to strain to urinate .  Gastrointestinal (Lower):   Patient denies diarrhea and constipation.  Gastrointestinal (  Upper):   Patient denies nausea and vomiting.  Constitutional:   Patient denies fever, night sweats, weight loss, and fatigue.  Skin:   Patient denies skin rash/ lesion and itching.  Eyes:   Patient denies blurred vision and double vision.  Ears/ Nose/ Throat:   Patient denies sore throat and sinus problems.  Hematologic/Lymphatic:   Patient denies swollen glands and easy bruising.  Cardiovascular:   Patient denies chest pains and leg swelling.   Respiratory:   Patient denies cough and shortness of breath.  Endocrine:   Patient denies excessive thirst.  Musculoskeletal:   Patient denies back pain and joint pain.  Neurological:   Patient denies headaches and dizziness.  Psychologic:   Patient denies depression and anxiety.   VITAL SIGNS: None   GU PHYSICAL EXAMINATION:    Prostate: Prostate about 40 grams. Prostate right lobe larger. Left lobe normal consistency, right lobe normal consistency. No prostate nodule. Left lobe no tenderness, right lobe no tenderness.    MULTI-SYSTEM PHYSICAL EXAMINATION:    Constitutional: Well-nourished. No physical deformities. Normally developed. Good grooming.  Neck: Neck symmetrical, not swollen. Normal tracheal position.  Respiratory: No labored breathing, no use of accessory muscles. Clear bilaterally  Cardiovascular: Normal temperature, normal extremity pulses, no swelling, no varicosities. Regular rate and rhythm.  Lymphatic: No enlargement of neck, axillae, groin.  Skin: No paleness, no jaundice, no cyanosis. No lesion, no ulcer, no rash.  Neurologic / Psychiatric: Oriented to time, oriented to place, oriented to person. No depression, no anxiety, no agitation.  Gastrointestinal: No mass, no tenderness, no rigidity, non obese abdomen.  Eyes: Normal conjunctivae. Normal eyelids.  Ears, Nose, Mouth, and Throat: Left ear no scars, no lesions, no masses. Right ear no scars, no lesions, no masses. Nose no scars, no lesions, no masses. Normal hearing. Normal lips.  Musculoskeletal: Normal gait and station of head and neck.     PAST DATA REVIEWED:  Source Of History:  Patient  Lab Test Review:   PSA  Records Review:   Pathology Reports  Urine Test Review:   Urinalysis  X-Ray Review: MRI Prostate GSORAD: Reviewed Films.     03/05/16 09/05/15  PSA  Total PSA 11.40  9.58   Free PSA  0.57   % Free PSA  [id number]     PROCEDURES: None   ASSESSMENT:      ICD-10 Details  1 GU:    Prostate Cancer - C61    PLAN:           Document Letter(s):  Created for Patient: Clinical Summary   Created for Patient: Clinical Summary         Notes:   1. Prostate cancer: I had a detailed discussion with Rodney Graves and his wife.   The patient was counseled about the natural history of prostate cancer and the standard treatment options that are available for prostate cancer. It was explained to him how his age and life expectancy, clinical stage, Gleason score, and PSA affect his prognosis, the decision to proceed with additional staging studies, as well as how that information influences recommended treatment strategies. We discussed the roles for active surveillance, radiation therapy, surgical therapy, androgen deprivation, as well as ablative therapy options for the treatment of prostate cancer as appropriate to his individual cancer situation. We discussed the risks and benefits of these options with regard to their impact on cancer control and also in terms of potential adverse events, complications, and impact on quality of life particularly related  to urinary and sexual function. The patient was encouraged to ask questions throughout the discussion today and all questions were answered to his stated satisfaction. In addition, the patient was provided with and/or directed to appropriate resources and literature for further education about prostate cancer and treatment options.   We discussed surgical therapy for prostate cancer including the different available surgical approaches. We discussed, in detail, the risks and expectations of surgery with regard to cancer control, urinary control, and erectile function as well as the expected postoperative recovery process. Additional risks of surgery including but not limited to bleeding, infection, hernia formation, nerve damage, lymphocele formation, bowel/rectal injury potentially necessitating colostomy, damage to the urinary tract resulting  in urine leakage, urethral stricture, and the cardiopulmonary risks such as myocardial infarction, stroke, death, venothromboembolism, etc. were explained. The risk of open surgical conversion for robotic/laparoscopic prostatectomy was also discussed.   He is scheduled to meet with Dr. Tammi Klippel and Dr. Alen Blew this afternoon. He will notify me if he wishes to proceed with surgical treatment. My tentative plan would be to perform a bilateral nerve sparing robot-assisted laparoscopic radical prostatectomy and pelvic lymphadenectomy.   CC: Dr. Eda Graves  Dr. Seward Graves  Dr. Zola Button  Dr. Tyler Pita          E & M CODE: I spent at least 60 minutes face to face with the patient, more than 50% of that time was spent on counseling and/or coordinating care.

## 2016-07-23 NOTE — Progress Notes (Signed)
[place] Psychosocial Distress Screening Spiritual Care  Met with Rodney Graves and spouse in [facility] to introduce [facility] team/resources, reviewing distress screen per protocol.  The patient scored a 5 on the Psychosocial Distress Thermometer which indicates moderate distress. Also assessed for distress and other psychosocial needs.   ONCBCN DISTRESS SCREENING 07/23/2016  Screening Type Initial Screening  Distress experienced in past week (1-10) 5  Practical problem type Insurance;Work/school  Emotional problem type Nervousness/Anxiety  Spiritual/Religous concerns type Facing my mortality  Referral to support programs Yes   [name] reflected well on his distress, coping mechanisms, and overall decrease in anxiety with the additional info/support of care team from [facility]. He uses humor to cope and make light of things, while also cycling around to deeper reflection and self-disclosure. One guiding image he uses is "You can lead a horse to water," then naming that he has to choose to engage the support available to him--and that right now he wants all the support he can get.  Provided pastoral presence, reflective listening, normalization of feelings, introduction to [place] team/resources, and encouragement to try any that sound interesting/helpful.   Follow up needed: No. Per pt, no other needs at this time. He plans to continue to seek out support.   [place], [place], [facility] Pager [phone] Voicemail [phone]

## 2016-07-23 NOTE — Progress Notes (Signed)
GU Location of Tumor / Histology: prostatic adenocarcinoma  If Prostate Cancer, Gleason Score is (3 + 4) and PSA is (11.4)  Rodney Graves was referred by Dr. Delfina Redwood to Dr. Junious Silk for evaluation of an elevated PSA in January 2018.  Biopsies of prostate (if applicable) revealed:    Past/Anticipated interventions by urology, if any: biopsy and referral to [facility]  Past/Anticipated interventions by medical oncology, if any: no  Weight changes, if any: no  Bowel/Bladder complaints, if any: No LUTS. Reports ED.    Nausea/Vomiting, if any: no  Pain issues, if any:    SAFETY ISSUES:  Prior radiation? No  Pacemaker/ICD? ?  Possible current pregnancy? no  Is the patient on methotrexate? no  Current Complaints / other details:  56 year old male. Married.

## 2016-07-23 NOTE — Progress Notes (Signed)
Radiation Oncology         [phone] ________________________________  Multidisciplinary Prostate Cancer Clinic  Initial Radiation Oncology Consultation  Name: Rodney Graves MRN: [id number]  Date: 07/23/2016  DOB: [birth year]  AO:ZHYQMV,HQIONG D, MD  Rodney Bring, MD   REFERRING PHYSICIAN: Raynelle Bring, MD  DIAGNOSIS: 56 y.o. gentleman with stage T1c adenocarcinoma of the prostate with a Gleason's score of 3+4 and a PSA of 11.4    ICD-9-CM ICD-10-CM   1. Prostate cancer ([place]) [name] is a 56 y.o. gentleman.  He was noted to have an elevated PSA of 11.4 by his primary care physician, Dr. Delfina Graves.  Accordingly, he was referred for evaluation in urology by Dr. Junious Graves in January 2018,  digital rectal examination was performed at that time revealing a  40 gram prostate with no nodules..   Of note, the patient has a history of elevated PSA dating back to 2015.  A previous TRUSBx in 04/2013 was negative for malignancy. PSA in 01/2015 was 11.4.  Repeat TRUSPBx in 03/2015 demonstrated one core of atypia in the left base, otherwise benign.   PSA in 08/2015 was 9.58. PSA 03/05/2016 was 11.4.   An MRI was performed on the prostate on 04/26/2016 revealing no no focal lesion in the peripheral gland. However, a 1.1 x 2.0 cm lesion was noted on the right anterior aspect of the mid central gland. Its appearance was worrisome for macroscopic high-grade prostate cancer within the central gland. The lesion abuts and displaces the capsule, concerning for extracapsular extension. No evidence of involvement of the seminal vesicles, lymphadenopathy or bony metastatic disease.  The patient proceeded to MR fusion transrectal ultrasound with 13 biopsies of the prostate on 06/06/16.  The prostate volume measured 46 cc.  Out of 12 standard core biopsies, 1 was positive with a Gleason score of 3+3.  The pt was also biopsied at a region noted to be suspicious by MRI and  this revealed a lesion with a Gleason score of 3+4=7 seen in right anterior ROI.  The patient reviewed the biopsy results with his urologist and he has kindly been referred today to the multidisciplinary prostate cancer clinic for presentation of pathology and radiology studies in our conference for discussion of potential radiation treatment options and clinical evaluation.  PREVIOUS RADIATION THERAPY: No  PAST MEDICAL HISTORY:  has a past medical history of Hypertension; Kidney stone; and Prostate cancer ([place]).    PAST SURGICAL HISTORY:History reviewed. No pertinent surgical history.  FAMILY HISTORY: family history is not on file.  SOCIAL HISTORY:  reports that he has never smoked. He has never used smokeless tobacco. He reports that he does not drink alcohol or use drugs. 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  ALLERGIES: Codeine  MEDICATIONS:  Current Outpatient Prescriptions  Medication Sig Dispense Refill  . amLODipine (NORVASC) 5 MG tablet Take 5 mg by mouth daily.    . tamsulosin (FLOMAX) 0.4 MG CAPS capsule Take 1 capsule (0.4 mg total) by mouth daily. 30 capsule 0   No current facility-administered medications for this encounter.     REVIEW OF SYSTEMS:  On review of systems, the patient reports that he is doing well overall. Pt wears glasses and dentures. He denies any chest pain, shortness of breath, cough, fevers, chills, night sweats, unintended weight changes. He denies any bowel disturbances, and denies abdominal pain, nausea or vomiting. He denies any new musculoskeletal or joint aches or  pains. His IPSS was 6, indicating mild urinary symptoms. He has ED but is able to complete sexual activity with most attempts with the assistance of Viagra. A complete review of systems is obtained and is otherwise negative.  PHYSICAL EXAM:  Wt Readings from Last 3 Encounters:  08/30/13 195 lb (88.5 kg)   Temp Readings from Last 3 Encounters:  08/30/13 98.6 F (37 C) (Oral)    BP Readings from Last 3 Encounters:  08/30/13 113/71   Pulse Readings from Last 3 Encounters:  08/30/13 (!) 57    In general this is a well appearing African American male in no acute distress. He is alert and oriented x4 and appropriate throughout the examination. HEENT reveals that the patient is normocephalic, atraumatic. EOMs are intact. PERRLA. Skin is intact without any evidence of gross lesions. Cardiovascular exam reveals a regular rate and rhythm, no clicks rubs or murmurs are auscultated. Chest is clear to auscultation bilaterally. Lymphatic assessment is performed and does not reveal any adenopathy in the cervical, supraclavicular, axillary, or inguinal chains. Abdomen has active bowel sounds in all quadrants and is intact. The abdomen is soft, non tender, non distended. Lower extremities are negative for pretibial pitting edema, deep calf tenderness, cyanosis or clubbing.  KPS = 100  100 - Normal; no complaints; no evidence of disease. 90   - Able to carry on normal activity; minor signs or symptoms of disease. 80   - Normal activity with effort; some signs or symptoms of disease. 57   - Cares for self; unable to carry on normal activity or to do active work. 60   - Requires occasional assistance, but is able to care for most of his personal needs. 50   - Requires considerable assistance and frequent medical care. 45   - Disabled; requires special care and assistance. 33   - Severely disabled; hospital admission is indicated although death not imminent. 30   - Very sick; hospital admission necessary; active supportive treatment necessary. 10   - Moribund; fatal processes progressing rapidly. 0     - Dead  Karnofsky DA, Abelmann Corning, Craver LS and Burchenal [facility] [phone]) The use of the nitrogen mustards in the palliative treatment of carcinoma: with particular reference to bronchogenic carcinoma Cancer 1 634-56   LABORATORY DATA:  Lab Results  Component Value Date   WBC 11.7  (H) 08/30/2013   HGB 12.1 (L) 08/30/2013   HCT 35.7 (L) 08/30/2013   MCV 87.9 08/30/2013   PLT 251 08/30/2013   Lab Results  Component Value Date   NA 142 08/30/2013   K 3.8 08/30/2013   CL 106 08/30/2013   CO2 20 08/30/2013   Lab Results  Component Value Date   ALT 24 08/30/2013   AST 25 08/30/2013   ALKPHOS 86 08/30/2013   BILITOT 1.0 08/30/2013     RADIOGRAPHY: No results found.    IMPRESSION/PLAN: 56 y.o. gentleman with an intermediate risk, stage T1c adenocarcinoma of the prostate with a PSA of 11.4 and a Gleason score of 3+4.  Dr. Tammi Klippel met with the patient and his wife to discuss the patient's workup and outlines the nature of prostate cancer in this setting.   The patient's T stage, Gleason's score, and PSA put him into the intermediate risk group. Accordingly he is eligible for a variety of potential treatment options including external radiation or brachytherapy. We discussed the available radiation techniques, and focused on the details of logistics and delivery. We discussed and outlined the  risks, benefits, short and long-term effects associated with radiotherapy and compared and contrasted these with prostatectomy.   At the end of the conversation the patient is undecided as to how he would like to move forward with treatment. He has not had placement of gold fiducial markers. We will contact [facility] to make arrangements for fiducial marker placement prior to simulation should he choose to move forward with IMRT. We will follow up with him in the next week to answer any further questions that he may have and determine his preference for treatment. We will share our discussion with Dr. Junious Graves and Dr. Alinda Money.  We enjoyed meeting the patient and his wife today and look forward to participating in his care in the future.   We spent 60 minutes face to face with the patient and more than 50% of that time was spent in counseling and/or coordination of care.       Nicholos Johns, PA-C  And     Tyler Pita, MD [place] Director and Director of Stereotactic Radiosurgery Direct Dial: [phone]  Fax: [phone] [email or web address]  Skype  [email or web address]       This document serves as a record of services personally performed by Tyler Pita, MD and Freeman Caldron, PA-C. It was created on their behalf by Linward Natal, a trained medical scribe. The creation of this record is based on the scribe's personal observations and the provider's statements to them. This document has been checked and approved by the attending provider.

## 2016-07-23 NOTE — Progress Notes (Signed)
Reason for Referral: Prostate cancer.   HPI: This is a 56 year old gentleman currently of [place] where he lived the majority of his life. He has history of hypertension but for the most part and good health and shape. He was noted to have an elevated PSA by his primary care physician and was referred to Dr. Junious Silk. At that time he is PSA was up to 11.4 and MRI obtained on January 2018. The MRI showed a 2 cm right anterior lesion suspicious for malignancy. A prostate biopsy obtained in 06/06/2016. The biopsy showed a Gleason score 3+3 = 6 and right to mid medial core and a Gleason score 3+4 = 7 in the right anterior area with 3 area involvement of 90%, 30% and 10% of malignancy. He denied any urinary symptoms related to these findings. He denied any frequency, nocturia or urgency. He denied any skeletal complaints or hospitalizations.  He does not report any headaches, blurry vision, syncope or seizures. He does not report any fevers, chills or sweats. He is not report any cough, wheezing or hemoptysis. He does not report any nausea, vomiting or abdominal pain. He does not report any frequency urgency or hesitancy. He does not report any skeletal complaints. Remaining review of systems unremarkable.   Past Medical History:  Diagnosis Date  . Hypertension   . Kidney stone   . Prostate cancer ([place])   :  No past surgical history on file.:   Current Outpatient Prescriptions:  .  amLODipine (NORVASC) 5 MG tablet, Take 5 mg by mouth daily., Disp: , Rfl:  .  diazepam (VALIUM) 10 MG tablet, , Disp: , Rfl:  .  ondansetron (ZOFRAN) 4 MG tablet, Take 1 tablet (4 mg total) by mouth every 6 (six) hours., Disp: 12 tablet, Rfl: 0 .  oxyCODONE-acetaminophen (PERCOCET/ROXICET) 5-325 MG per tablet, Take 1-2 tablets by mouth every 6 (six) hours as needed for severe pain., Disp: 20 tablet, Rfl: 0 .  tamsulosin (FLOMAX) 0.4 MG CAPS capsule, Take 1 capsule (0.4 mg total) by mouth daily., Disp: 30 capsule, Rfl:  0:  Allergies  Allergen Reactions  . Codeine     Hallucinations   :  No family history on file.:  Social History   Social History  . Marital status: Married    Spouse name: N/A  . Number of children: N/A  . Years of education: N/A   Occupational History  . Not on file.   Social History Main Topics  . Smoking status: Never Smoker  . Smokeless tobacco: Not on file  . Alcohol use No  . Drug use: No  . Sexual activity: Not on file   Other Topics Concern  . Not on file   Social History Narrative  . No narrative on file  :  Pertinent items are noted in HPI.  Exam: ECOG 0 General appearance: alert and cooperative appeared without distress. Throat: lips, mucosa, and tongue normal; teeth and gums normal Neck: no adenopathy Back: negative Resp: clear to auscultation bilaterally without rhonchi or wheezes. Cardio: regular rate and rhythm, S1, S2 normal, no murmur, click, rub or gallop GI: soft, non-tender; bowel sounds normal; no masses,  no organomegaly Extremities: extremities normal, atraumatic, no cyanosis or edema Lymph nodes: Cervical, supraclavicular, and axillary nodes normal.  CBC    Component Value Date/Time   WBC 11.7 (H) 08/30/2013 1321   RBC 4.06 (L) 08/30/2013 1321   HGB 12.1 (L) 08/30/2013 1321   HCT 35.7 (L) 08/30/2013 1321   PLT 251 08/30/2013  1321   MCV 87.9 08/30/2013 1321   MCH 29.8 08/30/2013 1321   MCHC 33.9 08/30/2013 1321   RDW 12.6 08/30/2013 1321   LYMPHSABS 1.7 08/30/2013 1321   MONOABS 0.5 08/30/2013 1321   EOSABS 0.0 08/30/2013 1321   BASOSABS 0.0 08/30/2013 1321     Chemistry      Component Value Date/Time   NA 142 08/30/2013 1321   K 3.8 08/30/2013 1321   CL 106 08/30/2013 1321   CO2 20 08/30/2013 1321   BUN 12 08/30/2013 1321   CREATININE 1.10 04/26/2016 1208      Component Value Date/Time   CALCIUM 9.2 08/30/2013 1321   ALKPHOS 86 08/30/2013 1321   AST 25 08/30/2013 1321   ALT 24 08/30/2013 1321   BILITOT 1.0  08/30/2013 1321       Assessment and Plan:   56 year old gentleman diagnosed with prostate cancer. He has a PSA of 11.4 and a Gleason score of 3+4 = 7 in 3 cores detected on a 2 cm lesion on MRI in the right anterior aspect of the mid central gland. He is case was discussed today the prostate cancer multidisciplinary clinic including review of his imaging studies as well as pathology slides with the reviewing pathologist.  His options of treatment were reviewed today which include primary surgical therapy versus radiation therapy with androgen deprivation. He has intermediate risk disease without therapy he is at risk of developing advanced malignancy that is in curative at that time. He is interested in getting as much information as possible and will make his decision in the near future. He understands without definitive treatment at this time, palliative options in the future do exist but they are not curative.

## 2016-07-23 NOTE — Progress Notes (Signed)
                               Care Plan Summary  Name: Rodney Graves DOB: 1960-04-29   Your Medical Team:   Urologist -  Dr. Raynelle Bring, [facility]  Radiation Oncologist - Dr. Tyler Pita, [facility]   Medical Oncologist - Dr. Zola Button, [place]  Recommendations: 1) Robotic Prostatectomy 2) Radiation  3) Brachytherapy  * These recommendations are based on information available as of today's consult.      Recommendations may change depending on the results of further tests or exams.  Next Steps: 1) Consider your options and call Cira Rue, RN with your decision.   When appointments need to be scheduled, you will be contacted by [facility] and/or [facility].  Questions?  Please do not hesitate to call Cira Rue, RN, BSN, OCN at [phone] any questions or concerns.  Shirlean Mylar is your Oncology Nurse Navigator and is available to assist you while you're receiving your medical care at [facility].  Patient given a folder with "Falls Prevention" Sheet and a copy of all providers business cards.

## 2016-08-01 ENCOUNTER — Telehealth: Payer: Self-pay | Admitting: Urology

## 2016-08-01 NOTE — Telephone Encounter (Signed)
I spoke with Mr. Flatt today regarding his treatment preference.  He has decided that he would like to move forward with EBRT. I will pass this information along to [facility] and we will move forward with coordinating fiducial marker placement with Dr. Junious Silk and scheduling CT SIM in the near future.

## 2016-08-01 NOTE — Telephone Encounter (Signed)
LMOVM at the below listed cell number, requesting that patient return my call regarding his decision for treatment and/or to answer any further questions he may have regarding the option of radiotherapy.  I tried home number as well but there was no answer and no machine.

## 2016-08-13 ENCOUNTER — Telehealth: Payer: Self-pay | Admitting: *Deleted

## 2016-08-13 NOTE — Telephone Encounter (Signed)
CALLED PATIENT TO INFORM OF GOLD SEED PLACEMENT FOR 09-23-16- ARRIVAL TIME - 2:30 PM @ DR. Lyndal Rainbow OFFICE AND HIS SIM ON 09-27-16 @ 1 PM @ DR. MANNING'S OFFICE, LVM FOR A RETURN CALL

## 2016-09-27 ENCOUNTER — Ambulatory Visit
Admission: RE | Admit: 2016-09-27 | Discharge: 2016-09-27 | Disposition: A | Discharge status: Home / Self Care | Payer: BLUE CROSS/BLUE SHIELD | Source: Ambulatory Visit | Attending: Radiation Oncology | Admitting: Radiation Oncology

## 2016-09-27 ENCOUNTER — Encounter: Payer: Self-pay | Admitting: Medical Oncology

## 2016-09-27 DIAGNOSIS — C61 Malignant neoplasm of prostate: Secondary | ICD-10-CM | POA: Insufficient documentation

## 2016-09-27 DIAGNOSIS — Z510 Encounter for antineoplastic radiation therapy: Secondary | ICD-10-CM | POA: Insufficient documentation

## 2016-09-27 NOTE — Progress Notes (Signed)
  Radiation Oncology         [phone] ________________________________  Name: Rodney Graves MRN: [id number]  Date: 09/27/2016  DOB: 1960/04/27  SIMULATION AND TREATMENT PLANNING NOTE    ICD-10-CM   1. Prostate cancer [facility] [address]) C61     DIAGNOSIS:  56 y.o. gentleman with stage T1c adenocarcinoma of the prostate with a Gleason's score of 3+4 and a PSA of 11.4  NARRATIVE:  The patient was brought to the [facility].  Identity was confirmed.  All relevant records and images related to the planned course of therapy were reviewed.  The patient freely provided informed written consent to proceed with treatment after reviewing the details related to the planned course of therapy. The consent form was witnessed and verified by the simulation staff.  Then, the patient was set-up in a stable reproducible supine position for radiation therapy.  A vacuum lock pillow device was custom fabricated to position his legs in a reproducible immobilized position.  Then, I performed a urethrogram under sterile conditions to identify the prostatic apex.  CT images were obtained.  Surface markings were placed.  The CT images were loaded into the planning software.  Then the prostate target and avoidance structures including the rectum, bladder, bowel and hips were contoured.  Treatment planning then occurred.  The radiation prescription was entered and confirmed.  A total of one complex treatment devices was fabricated. I have requested : Intensity Modulated Radiotherapy (IMRT) is medically necessary for this case for the following reason:  Rectal sparing.Marland Kitchen  PLAN:  The patient will receive 78 Gy in 40 fractions.  ________________________________  Sheral Apley Tammi Klippel, M.D.

## 2016-10-09 DIAGNOSIS — Z510 Encounter for antineoplastic radiation therapy: Secondary | ICD-10-CM | POA: Diagnosis not present

## 2016-10-10 ENCOUNTER — Ambulatory Visit
Admission: RE | Admit: 2016-10-10 | Discharge: 2016-10-10 | Disposition: A | Discharge status: Home / Self Care | Payer: BLUE CROSS/BLUE SHIELD | Source: Ambulatory Visit | Attending: Radiation Oncology | Admitting: Radiation Oncology

## 2016-10-10 ENCOUNTER — Encounter: Payer: Self-pay | Admitting: Medical Oncology

## 2016-10-10 DIAGNOSIS — C61 Malignant neoplasm of prostate: Secondary | ICD-10-CM

## 2016-10-10 DIAGNOSIS — Z510 Encounter for antineoplastic radiation therapy: Secondary | ICD-10-CM | POA: Diagnosis not present

## 2016-10-10 NOTE — Progress Notes (Signed)
Pt here for patient teaching.  Pt given Radiation and You booklet.  Reviewed areas of pertinence such as diarrhea, fatigue, hair loss, sexual and fertility changes, skin changes and urinary and bladder changes . Pt able to give teach back of to pat skin, use unscented/gentle soap, use baby wipes, have Imodium on hand and drink plenty of water,avoid applying anything to skin within 4 hours of treatment. Pt demonstrated understanding and verbalizes understanding of information given and will contact nursing with any questions or concerns.

## 2016-10-11 ENCOUNTER — Ambulatory Visit
Admission: RE | Admit: 2016-10-11 | Discharge: 2016-10-11 | Disposition: A | Discharge status: Home / Self Care | Payer: BLUE CROSS/BLUE SHIELD | Source: Ambulatory Visit | Attending: Radiation Oncology | Admitting: Radiation Oncology

## 2016-10-11 DIAGNOSIS — Z510 Encounter for antineoplastic radiation therapy: Secondary | ICD-10-CM | POA: Diagnosis not present

## 2016-10-14 ENCOUNTER — Ambulatory Visit
Admission: RE | Admit: 2016-10-14 | Discharge: 2016-10-14 | Disposition: A | Discharge status: Home / Self Care | Payer: BLUE CROSS/BLUE SHIELD | Source: Ambulatory Visit | Attending: Radiation Oncology | Admitting: Radiation Oncology

## 2016-10-14 DIAGNOSIS — Z510 Encounter for antineoplastic radiation therapy: Secondary | ICD-10-CM | POA: Diagnosis not present

## 2016-10-15 ENCOUNTER — Ambulatory Visit
Admission: RE | Admit: 2016-10-15 | Discharge: 2016-10-15 | Disposition: A | Discharge status: Home / Self Care | Payer: BLUE CROSS/BLUE SHIELD | Source: Ambulatory Visit | Attending: Radiation Oncology | Admitting: Radiation Oncology

## 2016-10-15 DIAGNOSIS — Z510 Encounter for antineoplastic radiation therapy: Secondary | ICD-10-CM | POA: Diagnosis not present

## 2016-10-16 ENCOUNTER — Ambulatory Visit
Admission: RE | Admit: 2016-10-16 | Discharge: 2016-10-16 | Disposition: A | Discharge status: Home / Self Care | Payer: BLUE CROSS/BLUE SHIELD | Source: Ambulatory Visit | Attending: Radiation Oncology | Admitting: Radiation Oncology

## 2016-10-16 DIAGNOSIS — Z510 Encounter for antineoplastic radiation therapy: Secondary | ICD-10-CM | POA: Diagnosis not present

## 2016-10-17 ENCOUNTER — Ambulatory Visit
Admission: RE | Admit: 2016-10-17 | Discharge: 2016-10-17 | Disposition: A | Discharge status: Home / Self Care | Payer: BLUE CROSS/BLUE SHIELD | Source: Ambulatory Visit | Attending: Radiation Oncology | Admitting: Radiation Oncology

## 2016-10-17 DIAGNOSIS — Z510 Encounter for antineoplastic radiation therapy: Secondary | ICD-10-CM | POA: Diagnosis not present

## 2016-10-18 ENCOUNTER — Ambulatory Visit
Admission: RE | Admit: 2016-10-18 | Discharge: 2016-10-18 | Disposition: A | Discharge status: Home / Self Care | Payer: BLUE CROSS/BLUE SHIELD | Source: Ambulatory Visit | Attending: Radiation Oncology | Admitting: Radiation Oncology

## 2016-10-18 DIAGNOSIS — Z510 Encounter for antineoplastic radiation therapy: Secondary | ICD-10-CM | POA: Diagnosis not present

## 2016-10-21 ENCOUNTER — Ambulatory Visit
Admission: RE | Admit: 2016-10-21 | Discharge: 2016-10-21 | Disposition: A | Discharge status: Home / Self Care | Payer: BLUE CROSS/BLUE SHIELD | Source: Ambulatory Visit | Attending: Radiation Oncology | Admitting: Radiation Oncology

## 2016-10-21 DIAGNOSIS — Z510 Encounter for antineoplastic radiation therapy: Secondary | ICD-10-CM | POA: Diagnosis not present

## 2016-10-22 ENCOUNTER — Ambulatory Visit
Admission: RE | Admit: 2016-10-22 | Discharge: 2016-10-22 | Disposition: A | Discharge status: Home / Self Care | Payer: BLUE CROSS/BLUE SHIELD | Source: Ambulatory Visit | Attending: Radiation Oncology | Admitting: Radiation Oncology

## 2016-10-22 DIAGNOSIS — Z510 Encounter for antineoplastic radiation therapy: Secondary | ICD-10-CM | POA: Diagnosis not present

## 2016-10-23 ENCOUNTER — Ambulatory Visit
Admission: RE | Admit: 2016-10-23 | Discharge: 2016-10-23 | Disposition: A | Discharge status: Home / Self Care | Payer: BLUE CROSS/BLUE SHIELD | Source: Ambulatory Visit | Attending: Radiation Oncology | Admitting: Radiation Oncology

## 2016-10-23 DIAGNOSIS — Z510 Encounter for antineoplastic radiation therapy: Secondary | ICD-10-CM | POA: Diagnosis not present

## 2016-10-24 ENCOUNTER — Ambulatory Visit
Admission: RE | Admit: 2016-10-24 | Discharge: 2016-10-24 | Disposition: A | Discharge status: Home / Self Care | Payer: BLUE CROSS/BLUE SHIELD | Source: Ambulatory Visit | Attending: Radiation Oncology | Admitting: Radiation Oncology

## 2016-10-24 DIAGNOSIS — Z510 Encounter for antineoplastic radiation therapy: Secondary | ICD-10-CM | POA: Diagnosis not present

## 2016-10-25 ENCOUNTER — Ambulatory Visit
Admission: RE | Admit: 2016-10-25 | Discharge: 2016-10-25 | Disposition: A | Discharge status: Home / Self Care | Payer: BLUE CROSS/BLUE SHIELD | Source: Ambulatory Visit | Attending: Radiation Oncology | Admitting: Radiation Oncology

## 2016-10-25 DIAGNOSIS — Z510 Encounter for antineoplastic radiation therapy: Secondary | ICD-10-CM | POA: Diagnosis not present

## 2016-10-28 ENCOUNTER — Ambulatory Visit
Admission: RE | Admit: 2016-10-28 | Discharge: 2016-10-28 | Disposition: A | Discharge status: Home / Self Care | Payer: BLUE CROSS/BLUE SHIELD | Source: Ambulatory Visit | Attending: Radiation Oncology | Admitting: Radiation Oncology

## 2016-10-28 DIAGNOSIS — Z510 Encounter for antineoplastic radiation therapy: Secondary | ICD-10-CM | POA: Diagnosis not present

## 2016-10-29 ENCOUNTER — Ambulatory Visit
Admission: RE | Admit: 2016-10-29 | Discharge: 2016-10-29 | Disposition: A | Discharge status: Home / Self Care | Payer: BLUE CROSS/BLUE SHIELD | Source: Ambulatory Visit | Attending: Radiation Oncology | Admitting: Radiation Oncology

## 2016-10-29 DIAGNOSIS — Z510 Encounter for antineoplastic radiation therapy: Secondary | ICD-10-CM | POA: Diagnosis not present

## 2016-10-30 ENCOUNTER — Ambulatory Visit
Admission: RE | Admit: 2016-10-30 | Discharge: 2016-10-30 | Disposition: A | Discharge status: Home / Self Care | Payer: BLUE CROSS/BLUE SHIELD | Source: Ambulatory Visit | Attending: Radiation Oncology | Admitting: Radiation Oncology

## 2016-10-30 DIAGNOSIS — Z510 Encounter for antineoplastic radiation therapy: Secondary | ICD-10-CM | POA: Diagnosis not present

## 2016-10-31 ENCOUNTER — Ambulatory Visit
Admission: RE | Admit: 2016-10-31 | Discharge: 2016-10-31 | Disposition: A | Discharge status: Home / Self Care | Payer: BLUE CROSS/BLUE SHIELD | Source: Ambulatory Visit | Attending: Radiation Oncology | Admitting: Radiation Oncology

## 2016-10-31 DIAGNOSIS — Z510 Encounter for antineoplastic radiation therapy: Secondary | ICD-10-CM | POA: Diagnosis not present

## 2016-11-01 ENCOUNTER — Ambulatory Visit
Admission: RE | Admit: 2016-11-01 | Discharge: 2016-11-01 | Disposition: A | Discharge status: Home / Self Care | Payer: BLUE CROSS/BLUE SHIELD | Source: Ambulatory Visit | Attending: Radiation Oncology | Admitting: Radiation Oncology

## 2016-11-01 DIAGNOSIS — Z510 Encounter for antineoplastic radiation therapy: Secondary | ICD-10-CM | POA: Diagnosis not present

## 2016-11-04 ENCOUNTER — Ambulatory Visit
Admission: RE | Admit: 2016-11-04 | Discharge: 2016-11-04 | Disposition: A | Discharge status: Home / Self Care | Payer: BLUE CROSS/BLUE SHIELD | Source: Ambulatory Visit | Attending: Radiation Oncology | Admitting: Radiation Oncology

## 2016-11-04 DIAGNOSIS — Z510 Encounter for antineoplastic radiation therapy: Secondary | ICD-10-CM | POA: Diagnosis not present

## 2016-11-05 ENCOUNTER — Encounter: Payer: Self-pay | Admitting: Medical Oncology

## 2016-11-05 ENCOUNTER — Ambulatory Visit
Admission: RE | Admit: 2016-11-05 | Discharge: 2016-11-05 | Disposition: A | Discharge status: Home / Self Care | Payer: BLUE CROSS/BLUE SHIELD | Source: Ambulatory Visit | Attending: Radiation Oncology | Admitting: Radiation Oncology

## 2016-11-05 DIAGNOSIS — Z510 Encounter for antineoplastic radiation therapy: Secondary | ICD-10-CM | POA: Diagnosis not present

## 2016-11-06 ENCOUNTER — Ambulatory Visit
Admission: RE | Admit: 2016-11-06 | Discharge: 2016-11-06 | Disposition: A | Discharge status: Home / Self Care | Payer: BLUE CROSS/BLUE SHIELD | Source: Ambulatory Visit | Attending: Radiation Oncology | Admitting: Radiation Oncology

## 2016-11-06 DIAGNOSIS — Z510 Encounter for antineoplastic radiation therapy: Secondary | ICD-10-CM | POA: Diagnosis not present

## 2016-11-07 ENCOUNTER — Ambulatory Visit
Admission: RE | Admit: 2016-11-07 | Discharge: 2016-11-07 | Disposition: A | Discharge status: Home / Self Care | Payer: BLUE CROSS/BLUE SHIELD | Source: Ambulatory Visit | Attending: Radiation Oncology | Admitting: Radiation Oncology

## 2016-11-07 DIAGNOSIS — Z510 Encounter for antineoplastic radiation therapy: Secondary | ICD-10-CM | POA: Diagnosis not present

## 2016-11-08 ENCOUNTER — Ambulatory Visit
Admission: RE | Admit: 2016-11-08 | Discharge: 2016-11-08 | Disposition: A | Discharge status: Home / Self Care | Payer: BLUE CROSS/BLUE SHIELD | Source: Ambulatory Visit | Attending: Radiation Oncology | Admitting: Radiation Oncology

## 2016-11-08 DIAGNOSIS — Z510 Encounter for antineoplastic radiation therapy: Secondary | ICD-10-CM | POA: Diagnosis not present

## 2016-11-11 ENCOUNTER — Ambulatory Visit
Admission: RE | Admit: 2016-11-11 | Discharge: 2016-11-11 | Disposition: A | Discharge status: Home / Self Care | Payer: BLUE CROSS/BLUE SHIELD | Source: Ambulatory Visit | Attending: Radiation Oncology | Admitting: Radiation Oncology

## 2016-11-11 DIAGNOSIS — Z510 Encounter for antineoplastic radiation therapy: Secondary | ICD-10-CM | POA: Diagnosis not present

## 2016-11-12 ENCOUNTER — Ambulatory Visit
Admission: RE | Admit: 2016-11-12 | Discharge: 2016-11-12 | Disposition: A | Discharge status: Home / Self Care | Payer: BLUE CROSS/BLUE SHIELD | Source: Ambulatory Visit | Attending: Radiation Oncology | Admitting: Radiation Oncology

## 2016-11-12 DIAGNOSIS — Z510 Encounter for antineoplastic radiation therapy: Secondary | ICD-10-CM | POA: Diagnosis not present

## 2016-11-13 ENCOUNTER — Ambulatory Visit
Admission: RE | Admit: 2016-11-13 | Discharge: 2016-11-13 | Disposition: A | Discharge status: Home / Self Care | Payer: BLUE CROSS/BLUE SHIELD | Source: Ambulatory Visit | Attending: Radiation Oncology | Admitting: Radiation Oncology

## 2016-11-13 DIAGNOSIS — Z510 Encounter for antineoplastic radiation therapy: Secondary | ICD-10-CM | POA: Diagnosis not present

## 2016-11-14 ENCOUNTER — Ambulatory Visit
Admission: RE | Admit: 2016-11-14 | Discharge: 2016-11-14 | Disposition: A | Discharge status: Home / Self Care | Payer: BLUE CROSS/BLUE SHIELD | Source: Ambulatory Visit | Attending: Radiation Oncology | Admitting: Radiation Oncology

## 2016-11-14 DIAGNOSIS — Z510 Encounter for antineoplastic radiation therapy: Secondary | ICD-10-CM | POA: Diagnosis not present

## 2016-11-15 ENCOUNTER — Ambulatory Visit
Admission: RE | Admit: 2016-11-15 | Discharge: 2016-11-15 | Disposition: A | Discharge status: Home / Self Care | Payer: BLUE CROSS/BLUE SHIELD | Source: Ambulatory Visit | Attending: Radiation Oncology | Admitting: Radiation Oncology

## 2016-11-15 DIAGNOSIS — Z510 Encounter for antineoplastic radiation therapy: Secondary | ICD-10-CM | POA: Diagnosis not present

## 2016-11-18 ENCOUNTER — Ambulatory Visit
Admission: RE | Admit: 2016-11-18 | Discharge: 2016-11-18 | Disposition: A | Discharge status: Home / Self Care | Payer: BLUE CROSS/BLUE SHIELD | Source: Ambulatory Visit | Attending: Radiation Oncology | Admitting: Radiation Oncology

## 2016-11-18 DIAGNOSIS — Z510 Encounter for antineoplastic radiation therapy: Secondary | ICD-10-CM | POA: Diagnosis not present

## 2016-11-19 ENCOUNTER — Ambulatory Visit
Admission: RE | Admit: 2016-11-19 | Discharge: 2016-11-19 | Disposition: A | Discharge status: Home / Self Care | Payer: BLUE CROSS/BLUE SHIELD | Source: Ambulatory Visit | Attending: Radiation Oncology | Admitting: Radiation Oncology

## 2016-11-19 DIAGNOSIS — Z510 Encounter for antineoplastic radiation therapy: Secondary | ICD-10-CM | POA: Diagnosis not present

## 2016-11-20 ENCOUNTER — Ambulatory Visit
Admission: RE | Admit: 2016-11-20 | Discharge: 2016-11-20 | Disposition: A | Discharge status: Home / Self Care | Payer: BLUE CROSS/BLUE SHIELD | Source: Ambulatory Visit | Attending: Radiation Oncology | Admitting: Radiation Oncology

## 2016-11-20 DIAGNOSIS — Z510 Encounter for antineoplastic radiation therapy: Secondary | ICD-10-CM | POA: Diagnosis not present

## 2016-11-21 ENCOUNTER — Ambulatory Visit
Admission: RE | Admit: 2016-11-21 | Discharge: 2016-11-21 | Disposition: A | Discharge status: Home / Self Care | Payer: BLUE CROSS/BLUE SHIELD | Source: Ambulatory Visit | Attending: Radiation Oncology | Admitting: Radiation Oncology

## 2016-11-21 DIAGNOSIS — Z510 Encounter for antineoplastic radiation therapy: Secondary | ICD-10-CM | POA: Diagnosis not present

## 2016-11-22 ENCOUNTER — Ambulatory Visit
Admission: RE | Admit: 2016-11-22 | Discharge: 2016-11-22 | Disposition: A | Discharge status: Home / Self Care | Payer: BLUE CROSS/BLUE SHIELD | Source: Ambulatory Visit | Attending: Radiation Oncology | Admitting: Radiation Oncology

## 2016-11-22 DIAGNOSIS — Z510 Encounter for antineoplastic radiation therapy: Secondary | ICD-10-CM | POA: Diagnosis not present

## 2016-11-25 ENCOUNTER — Ambulatory Visit
Admission: RE | Admit: 2016-11-25 | Discharge: 2016-11-25 | Disposition: A | Discharge status: Home / Self Care | Payer: BLUE CROSS/BLUE SHIELD | Source: Ambulatory Visit | Attending: Radiation Oncology | Admitting: Radiation Oncology

## 2016-11-25 DIAGNOSIS — Z510 Encounter for antineoplastic radiation therapy: Secondary | ICD-10-CM | POA: Diagnosis not present

## 2016-11-26 ENCOUNTER — Ambulatory Visit
Admission: RE | Admit: 2016-11-26 | Discharge: 2016-11-26 | Disposition: A | Discharge status: Home / Self Care | Payer: BLUE CROSS/BLUE SHIELD | Source: Ambulatory Visit | Attending: Radiation Oncology | Admitting: Radiation Oncology

## 2016-11-26 DIAGNOSIS — Z510 Encounter for antineoplastic radiation therapy: Secondary | ICD-10-CM | POA: Diagnosis not present

## 2016-11-27 ENCOUNTER — Ambulatory Visit
Admission: RE | Admit: 2016-11-27 | Discharge: 2016-11-27 | Disposition: A | Discharge status: Home / Self Care | Payer: BLUE CROSS/BLUE SHIELD | Source: Ambulatory Visit | Attending: Radiation Oncology | Admitting: Radiation Oncology

## 2016-11-27 DIAGNOSIS — Z510 Encounter for antineoplastic radiation therapy: Secondary | ICD-10-CM | POA: Diagnosis not present

## 2016-11-28 ENCOUNTER — Ambulatory Visit
Admission: RE | Admit: 2016-11-28 | Discharge: 2016-11-28 | Disposition: A | Discharge status: Home / Self Care | Payer: BLUE CROSS/BLUE SHIELD | Source: Ambulatory Visit | Attending: Radiation Oncology | Admitting: Radiation Oncology

## 2016-11-28 DIAGNOSIS — Z510 Encounter for antineoplastic radiation therapy: Secondary | ICD-10-CM | POA: Diagnosis not present

## 2016-11-29 ENCOUNTER — Ambulatory Visit
Admission: RE | Admit: 2016-11-29 | Discharge: 2016-11-29 | Disposition: A | Discharge status: Home / Self Care | Payer: BLUE CROSS/BLUE SHIELD | Source: Ambulatory Visit | Attending: Radiation Oncology | Admitting: Radiation Oncology

## 2016-11-29 DIAGNOSIS — Z510 Encounter for antineoplastic radiation therapy: Secondary | ICD-10-CM | POA: Diagnosis not present

## 2016-12-03 ENCOUNTER — Ambulatory Visit
Admission: RE | Admit: 2016-12-03 | Discharge: 2016-12-03 | Disposition: A | Discharge status: Home / Self Care | Payer: BLUE CROSS/BLUE SHIELD | Source: Ambulatory Visit | Attending: Radiation Oncology | Admitting: Radiation Oncology

## 2016-12-03 DIAGNOSIS — Z510 Encounter for antineoplastic radiation therapy: Secondary | ICD-10-CM | POA: Diagnosis not present

## 2016-12-04 ENCOUNTER — Ambulatory Visit
Admission: RE | Admit: 2016-12-04 | Discharge: 2016-12-04 | Disposition: A | Discharge status: Home / Self Care | Payer: BLUE CROSS/BLUE SHIELD | Source: Ambulatory Visit | Attending: Radiation Oncology | Admitting: Radiation Oncology

## 2016-12-04 DIAGNOSIS — Z510 Encounter for antineoplastic radiation therapy: Secondary | ICD-10-CM | POA: Diagnosis not present

## 2016-12-05 ENCOUNTER — Ambulatory Visit
Admission: RE | Admit: 2016-12-05 | Discharge: 2016-12-05 | Disposition: A | Discharge status: Home / Self Care | Payer: BLUE CROSS/BLUE SHIELD | Source: Ambulatory Visit | Attending: Radiation Oncology | Admitting: Radiation Oncology

## 2016-12-05 ENCOUNTER — Encounter: Payer: Self-pay | Admitting: Radiation Oncology

## 2016-12-05 DIAGNOSIS — Z510 Encounter for antineoplastic radiation therapy: Secondary | ICD-10-CM | POA: Diagnosis not present

## 2016-12-06 NOTE — Progress Notes (Signed)
°  Radiation Oncology         [phone] ________________________________  Name: Rodney Graves MRN: [id number]  Date: 12/05/2016  DOB: [birth year]  End of Treatment Note  Diagnosis:   56 y.o. gentleman with stage T1c adenocarcinoma of the prostate with a Gleason's score of 3+4 and a PSA of 11.4     Indication for treatment:  Curative, Definitive Radiotherapy       Radiation treatment dates:   10/10/2016 to 12/05/2016  Site/dose:   The prostate was treated to 78 Gy in 40 fractions of 1.95 Gy  Beams/energy:   The patient was treated with IMRT using volumetric arc therapy delivering 6 MV X-rays to clockwise and counterclockwise circumferential arcs with a 90 degree collimator offset to avoid dose scalloping.  Image guidance was performed with daily cone beam CT prior to each fraction to align to gold markers in the prostate and assure proper bladder and rectal fill volumes.  Immobilization was achieved with BodyFix custom mold.  Narrative: The patient tolerated radiation treatment relatively well.   The patient experienced modest fatigue and some minor urinary irritation including nocturia x 1-2, discomfort after voiding and weak stream. He was able to continue to empty his bladder without difficulty. He had 1-2 loose stools daily and reported continued night sweats related to his ADT which he has remained on throughout his course of treatments. He plans to speak with urology about continued recommendation for hormone therapy.   Plan: The patient has completed radiation treatment. He will return to radiation oncology clinic for routine followup in one month. I advised him to call or return sooner if he has any questions or concerns related to his recovery or treatment. ________________________________  Sheral Apley. Tammi Klippel, M.D.  This document serves as a record of services personally performed by Tyler Pita, MD. It was created on his behalf by Arlyce Harman, a trained medical scribe. The  creation of this record is based on the scribe's personal observations and the provider's statements to them. This document has been checked and approved by the attending provider.

## 2017-01-09 ENCOUNTER — Encounter: Payer: Self-pay | Admitting: Urology

## 2017-01-09 ENCOUNTER — Ambulatory Visit
Admission: RE | Admit: 2017-01-09 | Discharge: 2017-01-09 | Disposition: A | Discharge status: Home / Self Care | Payer: BLUE CROSS/BLUE SHIELD | Source: Ambulatory Visit | Attending: Urology | Admitting: Urology

## 2017-01-09 VITALS — BP 137/91 | HR 74 | Temp 98.0°F | Resp 20 | Ht 73.0 in | Wt 240.0 lb

## 2017-01-09 DIAGNOSIS — C61 Malignant neoplasm of prostate: Secondary | ICD-10-CM

## 2017-01-09 NOTE — Addendum Note (Signed)
Encounter addended by: Freeman Caldron, PA-C on: 01/09/2017  3:55 PM<BR>    Actions taken: Sign clinical note, LOS modified, Follow-up modified

## 2017-01-09 NOTE — Addendum Note (Signed)
Encounter addended by: Freeman Caldron, PA-C on: 01/09/2017  3:45 PM<BR>    Actions taken: Pend clinical note

## 2017-01-09 NOTE — Progress Notes (Signed)
Radiation Oncology         [phone] ________________________________  Name: Murtaza Shell MRN: [id number]  Date: 01/09/2017  DOB: 02/05/1961  Post Treatment Note  CC: Seward Carol, MD  Raynelle Bring, MD  Diagnosis:   56 y.o. gentleman with stage T1c adenocarcinoma of the prostate with a Gleason's score of 3+4 and a PSA of 11.4     Interval Since Last Radiation:  5 weeks  10/10/2016 to 12/05/2016:   The prostate was treated to 78 Gy in 40 fractions of 1.95 Gy   Narrative:  The patient returns today for routine follow-up.  He tolerated radiation treatment relatively well. The patient experienced modest fatigue and some minor urinary irritation including nocturia x 1-2, discomfort after voiding and weak stream. He was able to continue to empty his bladder without difficulty. He had 1-2 loose stools daily and reported continued night sweats related to his ADT which he has remained on throughout his course of treatments. He plans to speak with urology about continued recommendation for hormone therapy.                              On review of systems, the patient states that he is doing well overall. He continues with mild increased urgency, nocturia 2 per night and a weak flow stream. He denies dysuria, gross hematuria,increased daytime frequency, feelings of incomplete emptying or incontinence. His current IPSS is 9 indicating moderate lower urinary tract symptoms. His bowel movements have now returned to normal and he denies abdominal pain, nausea, vomiting, diarrhea or constipation. He reports a healthy appetite and is maintaining his weight.  ALLERGIES:  is allergic to codeine.  Meds: Current Outpatient Prescriptions  Medication Sig Dispense Refill  . amLODipine (NORVASC) 5 MG tablet Take 5 mg by mouth daily.     No current facility-administered medications for this encounter.     Physical Findings:  height is 6\' 1"  (1.854 m) and weight is 240 lb (108.9 kg). His oral  temperature is 98 F (36.7 C). His blood pressure is 137/91 (abnormal) and his pulse is 74. His respiration is 20 and oxygen saturation is 100%.  Pain Assessment Pain Score: 0-No pain/10 In general this is a well appearing african [place] male in no acute distress. He's alert and oriented x4 and appropriate throughout the examination. Cardiopulmonary assessment is negative for acute distress and he exhibits normal effort.   Lab Findings: Lab Results  Component Value Date   WBC 11.7 (H) 08/30/2013   HGB 12.1 (L) 08/30/2013   HCT 35.7 (L) 08/30/2013   MCV 87.9 08/30/2013   PLT 251 08/30/2013     Radiographic Findings: No results found.  Impression/Plan: 1. 56 y.o. gentleman with stage T1c adenocarcinoma of the prostate with a Gleason's score of 3+4 and a PSA of 11.4.   He will continue to follow up with urology for ongoing PSA determinations and has an appointment scheduled with Dr. Junious Silk on Dec. 27th. He understands what to expect with regards to PSA monitoring going forward. He anticipates continuing AD for a total of 2 years but plans to discuss this further with Dr. Junious Silk at his follow up visit.  I will look forward to following his response to treatment via correspondence with urology, and would be happy to continue to participate in his care if clinically indicated. I talked to the patient about what to expect in the future, including his risk for erectile dysfunction  and rectal bleeding. I encouraged him to call or return to the office if he has any questions regarding his previous radiation or possible radiation side effects. He was comfortable with this plan and will follow up as needed.    Nicholos Johns, PA-C

## 2017-02-24 DIAGNOSIS — Z0000 Encounter for general adult medical examination without abnormal findings: Secondary | ICD-10-CM | POA: Diagnosis not present

## 2017-02-24 DIAGNOSIS — I10 Essential (primary) hypertension: Secondary | ICD-10-CM | POA: Diagnosis not present

## 2017-03-19 DIAGNOSIS — C61 Malignant neoplasm of prostate: Secondary | ICD-10-CM | POA: Diagnosis not present

## 2017-03-20 DIAGNOSIS — C61 Malignant neoplasm of prostate: Secondary | ICD-10-CM | POA: Diagnosis not present

## 2017-03-27 DIAGNOSIS — C61 Malignant neoplasm of prostate: Secondary | ICD-10-CM | POA: Diagnosis not present

## 2017-06-26 DIAGNOSIS — N5201 Erectile dysfunction due to arterial insufficiency: Secondary | ICD-10-CM | POA: Diagnosis not present

## 2017-06-26 DIAGNOSIS — C61 Malignant neoplasm of prostate: Secondary | ICD-10-CM | POA: Diagnosis not present

## 2017-10-13 DIAGNOSIS — C61 Malignant neoplasm of prostate: Secondary | ICD-10-CM | POA: Diagnosis not present

## 2017-10-20 DIAGNOSIS — C61 Malignant neoplasm of prostate: Secondary | ICD-10-CM | POA: Diagnosis not present

## 2017-10-20 DIAGNOSIS — N5201 Erectile dysfunction due to arterial insufficiency: Secondary | ICD-10-CM | POA: Diagnosis not present

## 2018-03-12 DIAGNOSIS — K219 Gastro-esophageal reflux disease without esophagitis: Secondary | ICD-10-CM | POA: Diagnosis not present

## 2018-03-12 DIAGNOSIS — Z0000 Encounter for general adult medical examination without abnormal findings: Secondary | ICD-10-CM | POA: Diagnosis not present

## 2018-03-12 DIAGNOSIS — C61 Malignant neoplasm of prostate: Secondary | ICD-10-CM | POA: Diagnosis not present

## 2018-03-12 DIAGNOSIS — I10 Essential (primary) hypertension: Secondary | ICD-10-CM | POA: Diagnosis not present

## 2018-04-08 DIAGNOSIS — C61 Malignant neoplasm of prostate: Secondary | ICD-10-CM | POA: Diagnosis not present

## 2018-04-13 DIAGNOSIS — N5201 Erectile dysfunction due to arterial insufficiency: Secondary | ICD-10-CM | POA: Diagnosis not present

## 2018-04-13 DIAGNOSIS — Z8546 Personal history of malignant neoplasm of prostate: Secondary | ICD-10-CM | POA: Diagnosis not present

## 2018-10-19 DIAGNOSIS — Z8546 Personal history of malignant neoplasm of prostate: Secondary | ICD-10-CM | POA: Diagnosis not present

## 2018-10-19 DIAGNOSIS — N5201 Erectile dysfunction due to arterial insufficiency: Secondary | ICD-10-CM | POA: Diagnosis not present

## 2018-10-23 DIAGNOSIS — Z8546 Personal history of malignant neoplasm of prostate: Secondary | ICD-10-CM | POA: Diagnosis not present

## 2018-10-23 DIAGNOSIS — N5201 Erectile dysfunction due to arterial insufficiency: Secondary | ICD-10-CM | POA: Diagnosis not present

## 2019-04-08 DIAGNOSIS — Z0000 Encounter for general adult medical examination without abnormal findings: Secondary | ICD-10-CM | POA: Diagnosis not present

## 2019-04-08 DIAGNOSIS — Z13220 Encounter for screening for lipoid disorders: Secondary | ICD-10-CM | POA: Diagnosis not present

## 2019-09-15 DIAGNOSIS — Z1159 Encounter for screening for other viral diseases: Secondary | ICD-10-CM | POA: Diagnosis not present

## 2019-09-20 DIAGNOSIS — Z86010 Personal history of colonic polyps: Secondary | ICD-10-CM | POA: Diagnosis not present

## 2019-09-20 DIAGNOSIS — K640 First degree hemorrhoids: Secondary | ICD-10-CM | POA: Diagnosis not present

## 2019-09-20 DIAGNOSIS — D122 Benign neoplasm of ascending colon: Secondary | ICD-10-CM | POA: Diagnosis not present

## 2019-10-19 DIAGNOSIS — Z8546 Personal history of malignant neoplasm of prostate: Secondary | ICD-10-CM | POA: Diagnosis not present

## 2019-10-28 DIAGNOSIS — Z8546 Personal history of malignant neoplasm of prostate: Secondary | ICD-10-CM | POA: Diagnosis not present

## 2019-10-28 DIAGNOSIS — N5201 Erectile dysfunction due to arterial insufficiency: Secondary | ICD-10-CM | POA: Diagnosis not present

## 2020-04-20 DIAGNOSIS — Z13220 Encounter for screening for lipoid disorders: Secondary | ICD-10-CM | POA: Diagnosis not present

## 2020-04-20 DIAGNOSIS — Z0000 Encounter for general adult medical examination without abnormal findings: Secondary | ICD-10-CM | POA: Diagnosis not present

## 2021-05-03 DIAGNOSIS — I10 Essential (primary) hypertension: Secondary | ICD-10-CM | POA: Diagnosis not present

## 2021-05-03 DIAGNOSIS — Z0000 Encounter for general adult medical examination without abnormal findings: Secondary | ICD-10-CM | POA: Diagnosis not present

## 2021-05-03 DIAGNOSIS — Z13220 Encounter for screening for lipoid disorders: Secondary | ICD-10-CM | POA: Diagnosis not present

## 2021-05-14 DIAGNOSIS — Z20822 Contact with and (suspected) exposure to covid-19: Secondary | ICD-10-CM | POA: Diagnosis not present

## 2021-05-14 DIAGNOSIS — U071 COVID-19: Secondary | ICD-10-CM | POA: Diagnosis not present

## 2021-05-17 DIAGNOSIS — U071 COVID-19: Secondary | ICD-10-CM | POA: Diagnosis not present

## 2021-05-17 DIAGNOSIS — Z20822 Contact with and (suspected) exposure to covid-19: Secondary | ICD-10-CM | POA: Diagnosis not present

## 2021-05-22 DIAGNOSIS — Z20822 Contact with and (suspected) exposure to covid-19: Secondary | ICD-10-CM | POA: Diagnosis not present

## 2021-05-22 DIAGNOSIS — Z03818 Encounter for observation for suspected exposure to other biological agents ruled out: Secondary | ICD-10-CM | POA: Diagnosis not present

## 2022-05-23 DIAGNOSIS — R42 Dizziness and giddiness: Secondary | ICD-10-CM | POA: Diagnosis not present

## 2022-05-23 DIAGNOSIS — R0600 Dyspnea, unspecified: Secondary | ICD-10-CM | POA: Diagnosis not present

## 2022-05-23 DIAGNOSIS — I10 Essential (primary) hypertension: Secondary | ICD-10-CM | POA: Diagnosis not present

## 2022-05-30 NOTE — Progress Notes (Signed)
Chief Complaint  Patient presents with   New Patient (Initial Visit)    Dyspnea   History of Present Illness: 62 yo male with history of asthma, GERD, HTN and prostate cancer treated with XRT who is here today as a new patient, referred by Dr. Delfina Redwood, for the evaluation of dyspnea on exertion. No known cardiac disease. He describes dyspnea when climbing stairs but no dyspnea with normal exertion. He has chest pain at times with exertion and at rest. Sometimes the chest pain is after meals but it is never associated with dyspnea. He smoked remotely but none in 30 years. He is very active. LE edema at the end of the day but resolves at night.   Primary Care Physician: Seward Carol, MD   Past Medical History:  Diagnosis Date   Asthma    DOE (dyspnea on exertion)    Dyspnea    Elevated PSA    GERD (gastroesophageal reflux disease)    History of colon polyps    Hypertension    Kidney stone    Lightheadedness    Migraines    Prostate cancer [facility])     Past Surgical History:  Procedure Laterality Date   abscess removal     buttock    Current Outpatient Medications  Medication Sig Dispense Refill   amLODipine (NORVASC) 5 MG tablet Take 5 mg by mouth daily.     No current facility-administered medications for this visit.    Allergies  Allergen Reactions   Codeine     Hallucinations     Social History   Socioeconomic History   Marital status: Married    Spouse name: Not on file   Number of children: 2   Years of education: Not on file   Highest education level: Not on file  Occupational History   Occupation: Optometrist  Tobacco Use   Smoking status: Former    Packs/day: 0.50    Years: 15.00    Total pack years: 7.50    Types: Cigarettes   Smokeless tobacco: Never  Vaping Use   Vaping Use: Never used  Substance and Sexual Activity   Alcohol use: No   Drug use: No   Sexual activity: Not on file  Other Topics Concern   Not on file  Social History  Narrative   Not on file   Social Determinants of Health   Financial Resource Strain: Not on file  Food Insecurity: Not on file  Transportation Needs: Not on file  Physical Activity: Not on file  Stress: Not on file  Social Connections: Not on file  Intimate Partner Violence: Not on file    Family History  Problem Relation Age of Onset   CVA Mother    Lung cancer Father     Review of Systems:  As stated in the HPI and otherwise negative.   BP 116/86   Pulse (!) 51   Ht '6\' 1"'$  (1.854 m)   Wt 93 kg   SpO2 98%   BMI 27.05 kg/m   Physical Examination: General: Well developed, well nourished, NAD  HEENT: OP clear, mucus membranes moist  SKIN: warm, dry. No rashes. Neuro: No focal deficits  Musculoskeletal: Muscle strength 5/5 all ext  Psychiatric: Mood and affect normal  Neck: No JVD, no carotid bruits, no thyromegaly, no lymphadenopathy.  Lungs:Clear bilaterally, no wheezes, rhonci, crackles Cardiovascular: Regular rate and rhythm. No murmurs, gallops or rubs. Abdomen:Soft. Bowel sounds present. Non-tender.  Extremities: No lower extremity edema. Pulses  are 2 + in the bilateral DP/PT.  EKG:  EKG is ordered today. The ekg ordered today demonstrates Sinus brady, rate 51 bpm. Inferior Q waves  Recent Labs: No results found for requested labs within last 365 days.   Lipid Panel No results found for: "CHOL", "TRIG", "HDL", "CHOLHDL", "VLDL", "LDLCALC", "LDLDIRECT"   Wt Readings from Last 3 Encounters:  05/31/22 93 kg  01/09/17 108.9 kg  08/30/13 88.5 kg    Assessment and Plan:   1.  Dyspnea on exertion/Atypical chest pain; Echo to assess LVEF and exclude structural heart disease. Exercise nuclear stress test to assess myocardial perfusion with Q waves on EKG. Exclude ischemia and assess exercise tolerance.   Labs/ tests ordered today include:   Orders Placed This Encounter  Procedures   MYOCARDIAL PERFUSION IMAGING   EKG 12-Lead   ECHOCARDIOGRAM COMPLETE    Disposition:   F/U with me in 2-3 months   Signed, Lauree Chandler, MD, [facility] 05/31/2022 10:43 AM    [address], [place], [place]  [address] Phone: [phone]; Fax: [phone]

## 2022-05-31 ENCOUNTER — Ambulatory Visit: Payer: BC Managed Care – PPO | Attending: Cardiovascular Disease | Admitting: Cardiovascular Disease

## 2022-05-31 ENCOUNTER — Encounter: Payer: Self-pay | Admitting: *Deleted

## 2022-05-31 ENCOUNTER — Encounter: Payer: Self-pay | Admitting: Cardiovascular Disease

## 2022-05-31 VITALS — BP 116/86 | HR 51 | Ht 73.0 in | Wt 205.0 lb

## 2022-05-31 DIAGNOSIS — R0602 Shortness of breath: Secondary | ICD-10-CM | POA: Diagnosis not present

## 2022-05-31 DIAGNOSIS — R079 Chest pain, unspecified: Secondary | ICD-10-CM

## 2022-05-31 NOTE — Patient Instructions (Addendum)
Medication Instructions:  No changes *If you need a refill on your cardiac medications before your next appointment, please call your pharmacy*   Lab Work: none   Testing/Procedures: Your physician has requested that you have an echocardiogram. Echocardiography is a painless test that uses sound waves to create images of your heart. It provides your doctor with information about the size and shape of your heart and how well your heart's chambers and valves are working. This procedure takes approximately one hour. There are no restrictions for this procedure. Please do NOT wear cologne, perfume, aftershave, or lotions (deodorant is allowed). Please arrive 15 minutes prior to your appointment time.  Your physician has requested that you have en exercise stress myoview. For further information please visit [email or web address]. Please follow instruction sheet, as given.   Follow-Up: At [facility], you and your health needs are our priority.  As part of our continuing mission to provide you with exceptional heart care, we have created designated Provider Care Teams.  These Care Teams include your primary Cardiologist (physician) and Advanced Practice Providers (APPs -  Physician Assistants and Nurse Practitioners) who all work together to provide you with the care you need, when you need it.   Your next appointment:   2 month(s)  Provider:   Lauree Chandler, MD

## 2022-06-26 ENCOUNTER — Telehealth (HOSPITAL_COMMUNITY): Payer: Self-pay | Admitting: *Deleted

## 2022-06-26 NOTE — Telephone Encounter (Signed)
Left message on voicemail per DPR in reference to upcoming appointment scheduled on  07/03/22 with detailed instructions given per Myocardial Perfusion Study Information Sheet for the test. LM to arrive 15 minutes early, and that it is imperative to arrive on time for appointment to keep from having the test rescheduled. If you need to cancel or reschedule your appointment, please call the office within 24 hours of your appointment. Failure to do so may result in a cancellation of your appointment, and a $50 no show fee. Phone number given for call back for any questions. Kirstie Peri

## 2022-07-03 ENCOUNTER — Ambulatory Visit (HOSPITAL_BASED_OUTPATIENT_CLINIC_OR_DEPARTMENT_OTHER): Payer: BC Managed Care – PPO

## 2022-07-03 ENCOUNTER — Ambulatory Visit (HOSPITAL_COMMUNITY): Payer: BC Managed Care – PPO | Attending: Internal Medicine

## 2022-07-03 DIAGNOSIS — R0602 Shortness of breath: Secondary | ICD-10-CM | POA: Diagnosis not present

## 2022-07-03 DIAGNOSIS — R079 Chest pain, unspecified: Secondary | ICD-10-CM | POA: Insufficient documentation

## 2022-07-03 LAB — MYOCARDIAL PERFUSION IMAGING
Estimated workload: 6.5
Exercise duration (min): 4 min
Exercise duration (sec): 39 s
LV dias vol: 97 mL (ref 62–150)
LV sys vol: 41 mL
MPHR: 156 {beats}/min
Nuc Stress EF: 58 %
Peak HR: 151 {beats}/min
Percent HR: 96 %
Rest HR: 56 {beats}/min
Rest Nuclear Isotope Dose: 10.4 mCi
SDS: 0
SRS: 0
SSS: 0
ST Depression (mm): 0 mm
Stress Nuclear Isotope Dose: 30.9 mCi
TID: 0.88

## 2022-07-03 LAB — ECHOCARDIOGRAM COMPLETE
Area-P 1/2: 2.67 cm2
Est EF: 55
Height: 73 in
S' Lateral: 3 cm
Weight: 3280 oz

## 2022-07-03 MED ORDER — TECHNETIUM TC 99M TETROFOSMIN IV KIT
10.4000 | PACK | Freq: Once | INTRAVENOUS | Status: AC | PRN
  Administered 2022-07-03: 10.4 via INTRAVENOUS

## 2022-07-03 MED ORDER — TECHNETIUM TC 99M TETROFOSMIN IV KIT
30.9000 | PACK | Freq: Once | INTRAVENOUS | Status: AC | PRN
  Administered 2022-07-03: 30.9 via INTRAVENOUS

## 2022-07-10 ENCOUNTER — Telehealth: Payer: Self-pay | Admitting: *Deleted

## 2022-07-10 DIAGNOSIS — I77819 Aortic ectasia, unspecified site: Secondary | ICD-10-CM

## 2022-07-10 NOTE — Telephone Encounter (Signed)
Left detailed message and asked for call back - order placed for chest CTA to look at aorta.  Will need updated bloodwork for CT.

## 2022-07-10 NOTE — Telephone Encounter (Signed)
-----   Message from Kathleene Hazel, MD sent at 07/07/2022  2:33 PM EDT ----- His heart is strong. Mild leakiness of the mitral valve. Will need repeat echo in two years. His aorta may be mildly dilated. Can we see if he is willing to have a chest CTA to evaluate the aorta? Thanks, Rodney Graves

## 2022-07-26 NOTE — Telephone Encounter (Signed)
Left message to call back.  His ct chest aorta is scheduled next week.  Will need to have blood work prior.  Orders in EPIC.

## 2022-07-29 ENCOUNTER — Ambulatory Visit: Payer: BC Managed Care – PPO | Attending: Cardiovascular Disease

## 2022-07-29 DIAGNOSIS — I77819 Aortic ectasia, unspecified site: Secondary | ICD-10-CM | POA: Diagnosis not present

## 2022-07-30 LAB — BASIC METABOLIC PANEL
BUN/Creatinine Ratio: 9 — ABNORMAL LOW (ref 10–24)
BUN: 10 mg/dL (ref 8–27)
CO2: 16 mmol/L — ABNORMAL LOW (ref 20–29)
Calcium: 9.7 mg/dL (ref 8.6–10.2)
Chloride: 107 mmol/L — ABNORMAL HIGH (ref 96–106)
Creatinine, Ser: 1.06 mg/dL (ref 0.76–1.27)
Glucose: 88 mg/dL (ref 70–99)
Potassium: 3.8 mmol/L (ref 3.5–5.2)
Sodium: 144 mmol/L (ref 134–144)
eGFR: 78 mL/min/{1.73_m2} (ref >59)

## 2022-07-31 ENCOUNTER — Ambulatory Visit (HOSPITAL_COMMUNITY)
Admission: RE | Admit: 2022-07-31 | Discharge: 2022-07-31 | Disposition: A | Discharge status: Home / Self Care | Payer: BC Managed Care – PPO | Source: Ambulatory Visit | Attending: Cardiovascular Disease | Admitting: Cardiovascular Disease

## 2022-07-31 DIAGNOSIS — I77819 Aortic ectasia, unspecified site: Secondary | ICD-10-CM

## 2022-07-31 DIAGNOSIS — J439 Emphysema, unspecified: Secondary | ICD-10-CM | POA: Diagnosis not present

## 2022-07-31 MED ORDER — IOHEXOL 350 MG/ML SOLN
100.0000 mL | Freq: Once | INTRAVENOUS | Status: AC | PRN
  Administered 2022-07-31: 100 mL via INTRAVENOUS

## 2022-08-08 NOTE — Progress Notes (Signed)
Chief Complaint  Patient presents with   Follow-up    Dyspnea on exertion    History of Present Illness: 63 yo male with history of asthma, GERD, HTN and prostate cancer treated with XRT who is here today for follow up. I saw him as a new patient in March 2024 for the evaluation of dyspnea on exertion. He described dyspnea when climbing stairs but no dyspnea with normal exertion. He has chest pain at times with exertion and at rest. Sometimes the chest pain is after meals but it is never associated with dyspnea. He smoked remotely but none in 30 years. He is very active. LE edema at the end of the day but resolves at night. Nuclear stress test 07/03/22 without ischemia. Echo 07/03/22 with LVEF=55%. Mild to moderate mitral regurgitation. Chest CTA 07/31/22 with mild dilation of the ascending aorta (4.1cm).   He is here today for follow up. The patient denies any chest pain, palpitations, lower extremity edema, orthopnea, PND, dizziness, near syncope or syncope. Dyspnea is improved.   Primary Care Physician: Renford Dills, MD   Past Medical History:  Diagnosis Date   Asthma    DOE (dyspnea on exertion)    Dyspnea    Elevated PSA    GERD (gastroesophageal reflux disease)    History of colon polyps    Hypertension    Kidney stone    Lightheadedness    Migraines    Prostate cancer [facility])     Past Surgical History:  Procedure Laterality Date   abscess removal     buttock    Current Outpatient Medications  Medication Sig Dispense Refill   amLODipine (NORVASC) 5 MG tablet Take 5 mg by mouth daily.     No current facility-administered medications for this visit.    Allergies  Allergen Reactions   Codeine     Hallucinations     Social History   Socioeconomic History   Marital status: Married    Spouse name: Not on file   Number of children: 2   Years of education: Not on file   Highest education level: Not on file  Occupational History   Occupation: Fish farm manager   Tobacco Use   Smoking status: Former    Packs/day: 0.50    Years: 15.00    Additional pack years: 0.00    Total pack years: 7.50    Types: Cigarettes   Smokeless tobacco: Never  Vaping Use   Vaping Use: Never used  Substance and Sexual Activity   Alcohol use: No   Drug use: No   Sexual activity: Not on file  Other Topics Concern   Not on file  Social History Narrative   Not on file   Social Determinants of Health   Financial Resource Strain: Not on file  Food Insecurity: Not on file  Transportation Needs: Not on file  Physical Activity: Not on file  Stress: Not on file  Social Connections: Not on file  Intimate Partner Violence: Not on file    Family History  Problem Relation Age of Onset   CVA Mother    Lung cancer Father     Review of Systems:  As stated in the HPI and otherwise negative.   BP 114/78   Pulse 74   Ht 6\' 1"  (1.854 m)   Wt 92.1 kg   SpO2 98%   BMI 26.78 kg/m   Physical Examination: General: Well developed, well nourished, NAD  HEENT: OP clear, mucus membranes moist  SKIN: warm, dry. No rashes. Neuro: No focal deficits  Musculoskeletal: Muscle strength 5/5 all ext  Psychiatric: Mood and affect normal  Neck: No JVD, no carotid bruits, no thyromegaly, no lymphadenopathy.  Lungs:Clear bilaterally, no wheezes, rhonci, crackles Cardiovascular: Regular rate and rhythm. Soft systolic murmur.  Abdomen:Soft. Bowel sounds present. Non-tender.  Extremities: No lower extremity edema. Pulses are 2 + in the bilateral DP/PT.  EKG:  EKG is not ordered today. The ekg ordered today demonstrates   Echo 07/03/22:  1. Left ventricular ejection fraction, by estimation, is 55%. The left  ventricle has normal function. The left ventricle has no regional wall  motion abnormalities. There is mild left ventricular hypertrophy. Left  ventricular diastolic parameters are  indeterminate. The average left ventricular global longitudinal strain is  20.0 %. The  global longitudinal strain is normal.   2. Right ventricular systolic function is normal. The right ventricular  size is normal. There is normal pulmonary artery systolic pressure.   3. 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  Disjunction distance 4-5 mm. The mitral valve is myxomatous. Mild to  moderate, late systolic mitral valve regurgitation. No evidence of mitral  stenosis.   4. The aortic valve is tricuspid. There is mild calcification of the  aortic valve. Aortic valve regurgitation is not visualized. No aortic  stenosis is present.   5. Aortic dilatation noted. There is mild dilatation of the aortic root,  measuring 40 mm.   6. The inferior vena cava is normal in size with greater than 50%  respiratory variability, suggesting right atrial pressure of 3 mmHg.    Recent Labs: 07/29/2022: BUN 10; Creatinine, Ser 1.06; Potassium 3.8; Sodium 144   Lipid Panel No results found for: "CHOL", "TRIG", "HDL", "CHOLHDL", "VLDL", "LDLCALC", "LDLDIRECT"   Wt Readings from Last 3 Encounters:  08/09/22 92.1 kg  07/03/22 93 kg  05/31/22 93 kg    Assessment and Plan:   1.  Dyspnea on exertion/Atypical chest pain: Normal LV function on echo in April 2024. No ischemia on stress test in April 2024. Dyspnea has resolved. Could have been related to the pollen  2. Mitral regurgitation: Mild to moderate by echo in April 2024. Repeat echo in April 2026.   3. Thoracic aortic aneurysm: 4.1 cm at level of sinus of valsalva May 2024. Repeat chest CTA in one year.    Labs/ tests ordered today include:  No orders of the defined types were placed in this encounter.  Disposition:   F/U with me in 12 months   Signed, Verne Carrow, MD, [facility] 08/09/2022 9:22 AM    [facility] [address] [place], [place], [place]  [address] Phone: [phone]; Fax: [phone]

## 2022-08-09 ENCOUNTER — Encounter: Payer: Self-pay | Admitting: Cardiovascular Disease

## 2022-08-09 ENCOUNTER — Ambulatory Visit: Payer: BC Managed Care – PPO | Attending: Cardiovascular Disease | Admitting: Cardiovascular Disease

## 2022-08-09 VITALS — BP 114/78 | HR 74 | Ht 73.0 in | Wt 203.0 lb

## 2022-08-09 DIAGNOSIS — I340 Nonrheumatic mitral (valve) insufficiency: Secondary | ICD-10-CM | POA: Diagnosis not present

## 2022-08-09 DIAGNOSIS — I77819 Aortic ectasia, unspecified site: Secondary | ICD-10-CM | POA: Diagnosis not present

## 2022-08-09 DIAGNOSIS — R0602 Shortness of breath: Secondary | ICD-10-CM | POA: Diagnosis not present

## 2022-08-09 NOTE — Patient Instructions (Signed)
Medication Instructions:  No changes *If you need a refill on your cardiac medications before your next appointment, please call your pharmacy*   Lab Work: none If you have labs (blood work) drawn today and your tests are completely normal, you will receive your results only by: MyChart Message (if you have MyChart) OR A paper copy in the mail If you have any lab test that is abnormal or we need to change your treatment, we will call you to review the results.   Testing/Procedures: none   Follow-Up: At [facility], you and your health needs are our priority.  As part of our continuing mission to provide you with exceptional heart care, we have created designated Provider Care Teams.  These Care Teams include your primary Cardiologist (physician) and Advanced Practice Providers (APPs -  Physician Assistants and Nurse Practitioners) who all work together to provide you with the care you need, when you need it.  We recommend signing up for the patient portal called "MyChart".  Sign up information is provided on this After Visit Summary.  MyChart is used to connect with patients for Virtual Visits (Telemedicine).  Patients are able to view lab/test results, encounter notes, upcoming appointments, etc.  Non-urgent messages can be sent to your provider as well.   To learn more about what you can do with MyChart, go to [email or web address].    Your next appointment:   12 month(s)  Provider:   Christopher McAlhany, MD      

## 2022-09-10 DIAGNOSIS — C61 Malignant neoplasm of prostate: Secondary | ICD-10-CM | POA: Diagnosis not present

## 2022-09-10 DIAGNOSIS — Z0000 Encounter for general adult medical examination without abnormal findings: Secondary | ICD-10-CM | POA: Diagnosis not present

## 2022-09-10 DIAGNOSIS — I10 Essential (primary) hypertension: Secondary | ICD-10-CM | POA: Diagnosis not present

## 2022-09-10 DIAGNOSIS — I77810 Thoracic aortic ectasia: Secondary | ICD-10-CM | POA: Diagnosis not present

## 2022-11-11 DIAGNOSIS — Z8546 Personal history of malignant neoplasm of prostate: Secondary | ICD-10-CM | POA: Diagnosis not present

## 2022-11-11 DIAGNOSIS — N5201 Erectile dysfunction due to arterial insufficiency: Secondary | ICD-10-CM | POA: Diagnosis not present

## 2023-09-10 ENCOUNTER — Other Ambulatory Visit: Payer: Self-pay | Admitting: *Deleted

## 2023-09-10 DIAGNOSIS — I77819 Aortic ectasia, unspecified site: Secondary | ICD-10-CM

## 2023-09-10 NOTE — Progress Notes (Signed)
 Order placed for repeat chest ct angio aorta and bmp prior.

## 2023-10-12 NOTE — Progress Notes (Unsigned)
 No chief complaint on file.  History of Present Illness: 63 yo male with history of asthma, GERD, HTN and prostate cancer treated with XRT who is here today for follow up. I saw him as a new patient in March 2024 for the evaluation of dyspnea on exertion and chest pain. He smoked remotely but none in 30 years. Nuclear stress test 07/03/22 without ischemia. Echo 07/03/22 with LVEF=55%. Mild to moderate mitral regurgitation. Chest CTA 07/31/22 with mild dilation of the ascending aorta (4.1cm).   He is here today for follow up. The patient denies any chest pain, dyspnea, palpitations, lower extremity edema, orthopnea, PND, dizziness, near syncope or syncope.   Primary Care Physician: Rexanne Ingle, MD   Past Medical History:  Diagnosis Date   Asthma    DOE (dyspnea on exertion)    Dyspnea    Elevated PSA    GERD (gastroesophageal reflux disease)    History of colon polyps    Hypertension    Kidney stone    Lightheadedness    Migraines    Prostate cancer [facility])     Past Surgical History:  Procedure Laterality Date   abscess removal     buttock    Current Outpatient Medications  Medication Sig Dispense Refill   amLODipine (NORVASC) 5 MG tablet Take 5 mg by mouth daily.     No current facility-administered medications for this visit.    Allergies  Allergen Reactions   Codeine     Hallucinations     Social History   Socioeconomic History   Marital status: Married    Spouse name: Not on file   Number of children: 2   Years of education: Not on file   Highest education level: Not on file  Occupational History   Occupation: Fish farm manager  Tobacco Use   Smoking status: Former    Current packs/day: 0.50    Average packs/day: 0.5 packs/day for 15.0 years (7.5 ttl pk-yrs)    Types: Cigarettes   Smokeless tobacco: Never  Vaping Use   Vaping status: Never Used  Substance and Sexual Activity   Alcohol use: No   Drug use: No   Sexual activity: Not on file  Other Topics  Concern   Not on file  Social History Narrative   Not on file   Social Drivers of Health   Financial Resource Strain: Not on file  Food Insecurity: Not on file  Transportation Needs: Not on file  Physical Activity: Not on file  Stress: Not on file  Social Connections: Not on file  Intimate Partner Violence: Not on file    Family History  Problem Relation Age of Onset   CVA Mother    Lung cancer Father     Review of Systems:  As stated in the HPI and otherwise negative.   There were no vitals taken for this visit.  Physical Examination: General: Well developed, well nourished, NAD  HEENT: OP clear, mucus membranes moist  SKIN: warm, dry. No rashes. Neuro: No focal deficits  Musculoskeletal: Muscle strength 5/5 all ext  Psychiatric: Mood and affect normal  Neck: No JVD, no carotid bruits, no thyromegaly, no lymphadenopathy.  Lungs:Clear bilaterally, no wheezes, rhonci, crackles Cardiovascular: Regular rate and rhythm. No murmurs, gallops or rubs. Abdomen:Soft. Bowel sounds present. Non-tender.  Extremities: No lower extremity edema. Pulses are 2 + in the bilateral DP/PT.  EKG:  EKG is *** ordered today. The ekg ordered today demonstrates   Recent Labs: No results found  for requested labs within last 365 days.   Lipid Panel No results found for: CHOL, TRIG, HDL, CHOLHDL, VLDL, LDLCALC, LDLDIRECT   Wt Readings from Last 3 Encounters:  08/09/22 203 lb (92.1 kg)  07/03/22 205 lb (93 kg)  05/31/22 205 lb (93 kg)    Assessment and Plan:   1.  Dyspnea on exertion/Atypical chest pain: Normal LV function on echo in April 2024. No ischemia on stress test in April 2024. No dyspnea or chest pain over the past year.   2. Mitral regurgitation: Mild to moderate by echo in April 2024. Repeat echo in April 2026.   3. Thoracic aortic aneurysm: 4.1 cm at level of sinus of valsalva May 2024. *** Repeat chest CTA now  Labs/ tests ordered today include:  No orders  of the defined types were placed in this encounter.  Disposition:   F/U with me in 12 months   Signed, Lonni Cash, MD, [facility] 10/12/2023 12:15 PM    [facility] [address] [place], [place], [place]  [address] Phone: [phone]; Fax: [phone]

## 2023-10-13 ENCOUNTER — Ambulatory Visit: Attending: Cardiovascular Disease | Admitting: Cardiovascular Disease

## 2023-10-13 ENCOUNTER — Encounter: Payer: Self-pay | Admitting: Cardiovascular Disease

## 2023-10-13 VITALS — BP 124/72 | HR 70 | Ht 73.0 in | Wt 199.4 lb

## 2023-10-13 DIAGNOSIS — I340 Nonrheumatic mitral (valve) insufficiency: Secondary | ICD-10-CM

## 2023-10-13 DIAGNOSIS — I7121 Aneurysm of the ascending aorta, without rupture: Secondary | ICD-10-CM

## 2023-10-13 NOTE — Patient Instructions (Signed)
 Medication Instructions:  No changes   *If you need a refill on your cardiac medications before your next appointment, please call your pharmacy*  Testing/Procedures: Chest CT Angiogram/Aorta  Follow-Up: At [facility], you and your health needs are our priority.  As part of our continuing mission to provide you with exceptional heart care, our providers are all part of one team.  This team includes your primary Cardiologist (physician) and Advanced Practice Providers or APPs (Physician Assistants and Nurse Practitioners) who all work together to provide you with the care you need, when you need it.  Your next appointment:   1 year(s)  Provider:   Lonni Cash, MD    We recommend signing up for the patient portal called MyChart.  Sign up information is provided on this After Visit Summary.  MyChart is used to connect with patients for Virtual Visits (Telemedicine).  Patients are able to view lab/test results, encounter notes, upcoming appointments, etc.  Non-urgent messages can be sent to your provider as well.   To learn more about what you can do with MyChart, go to [email or web address].

## 2023-10-21 ENCOUNTER — Ambulatory Visit (HOSPITAL_COMMUNITY)
Admission: RE | Admit: 2023-10-21 | Discharge: 2023-10-21 | Disposition: A | Discharge status: Home / Self Care | Source: Ambulatory Visit | Attending: Cardiovascular Disease | Admitting: Cardiovascular Disease

## 2023-10-21 DIAGNOSIS — J439 Emphysema, unspecified: Secondary | ICD-10-CM | POA: Diagnosis not present

## 2023-10-21 DIAGNOSIS — I700 Atherosclerosis of aorta: Secondary | ICD-10-CM | POA: Diagnosis not present

## 2023-10-21 DIAGNOSIS — I7121 Aneurysm of the ascending aorta, without rupture: Secondary | ICD-10-CM | POA: Diagnosis not present

## 2023-10-21 DIAGNOSIS — I712 Thoracic aortic aneurysm, without rupture, unspecified: Secondary | ICD-10-CM | POA: Diagnosis not present

## 2023-10-21 MED ORDER — IOHEXOL 350 MG/ML SOLN
75.0000 mL | Freq: Once | INTRAVENOUS | Status: AC | PRN
Start: 2023-10-21 — End: 2023-10-21
  Administered 2023-10-21: 75 mL via INTRAVENOUS

## 2023-10-21 MED ORDER — SODIUM CHLORIDE (PF) 0.9 % IJ SOLN
INTRAMUSCULAR | Status: AC
  Filled 2023-10-21: qty 50

## 2023-10-27 ENCOUNTER — Ambulatory Visit: Payer: Self-pay | Admitting: Cardiovascular Disease

## 2023-11-13 DIAGNOSIS — Z0000 Encounter for general adult medical examination without abnormal findings: Secondary | ICD-10-CM | POA: Diagnosis not present

## 2023-11-13 DIAGNOSIS — Z13220 Encounter for screening for lipoid disorders: Secondary | ICD-10-CM | POA: Diagnosis not present

## 2024-01-15 DIAGNOSIS — N5203 Combined arterial insufficiency and corporo-venous occlusive erectile dysfunction: Secondary | ICD-10-CM | POA: Diagnosis not present

## 2024-01-15 DIAGNOSIS — N400 Enlarged prostate without lower urinary tract symptoms: Secondary | ICD-10-CM | POA: Diagnosis not present

## 2024-01-15 DIAGNOSIS — Z8546 Personal history of malignant neoplasm of prostate: Secondary | ICD-10-CM | POA: Diagnosis not present

## 2024-02-02 DIAGNOSIS — Z23 Encounter for immunization: Secondary | ICD-10-CM | POA: Diagnosis not present
